# Patient Record
Sex: Female | Born: 2000 | Race: White | Hispanic: No | Marital: Single | Smoking: Never smoker
Health system: Southern US, Community
[De-identification: ages and names within clinical notes are randomized; demographics above are authoritative.]

## PROBLEM LIST (undated history)

## (undated) DIAGNOSIS — F329 Major depressive disorder, single episode, unspecified: Secondary | ICD-10-CM

## (undated) DIAGNOSIS — I959 Hypotension, unspecified: Secondary | ICD-10-CM

## (undated) DIAGNOSIS — F410 Panic disorder [episodic paroxysmal anxiety] without agoraphobia: Secondary | ICD-10-CM

## (undated) DIAGNOSIS — F32A Depression, unspecified: Secondary | ICD-10-CM

## (undated) DIAGNOSIS — F419 Anxiety disorder, unspecified: Secondary | ICD-10-CM

## (undated) HISTORY — PX: KIDNEY SURGERY: SHX687

## (undated) HISTORY — PX: ADENOIDECTOMY: SUR15

## (undated) HISTORY — PX: TONSILLECTOMY: SUR1361

## (undated) HISTORY — PX: JOINT REPLACEMENT: SHX530

---
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
## 2018-04-13 ENCOUNTER — Encounter (HOSPITAL_COMMUNITY): Payer: Self-pay | Admitting: Emergency Medicine

## 2018-04-13 ENCOUNTER — Other Ambulatory Visit: Payer: Self-pay

## 2018-04-13 ENCOUNTER — Emergency Department (HOSPITAL_COMMUNITY)
Admission: EM | Admit: 2018-04-13 | Discharge: 2018-04-13 | Disposition: A | Discharge status: Home / Self Care | Payer: Managed Care, Other (non HMO) | Attending: Emergency Medicine | Admitting: Emergency Medicine

## 2018-04-13 ENCOUNTER — Emergency Department (HOSPITAL_COMMUNITY): Payer: Managed Care, Other (non HMO)

## 2018-04-13 DIAGNOSIS — R112 Nausea with vomiting, unspecified: Secondary | ICD-10-CM | POA: Insufficient documentation

## 2018-04-13 DIAGNOSIS — N12 Tubulo-interstitial nephritis, not specified as acute or chronic: Secondary | ICD-10-CM

## 2018-04-13 DIAGNOSIS — R1031 Right lower quadrant pain: Secondary | ICD-10-CM | POA: Insufficient documentation

## 2018-04-13 DIAGNOSIS — N760 Acute vaginitis: Secondary | ICD-10-CM | POA: Diagnosis not present

## 2018-04-13 DIAGNOSIS — N10 Acute tubulo-interstitial nephritis: Secondary | ICD-10-CM | POA: Insufficient documentation

## 2018-04-13 DIAGNOSIS — Z79899 Other long term (current) drug therapy: Secondary | ICD-10-CM | POA: Diagnosis not present

## 2018-04-13 DIAGNOSIS — B9689 Other specified bacterial agents as the cause of diseases classified elsewhere: Secondary | ICD-10-CM

## 2018-04-13 DIAGNOSIS — R52 Pain, unspecified: Secondary | ICD-10-CM | POA: Diagnosis present

## 2018-04-13 HISTORY — DX: Hypotension, unspecified: I95.9

## 2018-04-13 HISTORY — DX: Major depressive disorder, single episode, unspecified: F32.9

## 2018-04-13 HISTORY — DX: Depression, unspecified: F32.A

## 2018-04-13 HISTORY — DX: Panic disorder (episodic paroxysmal anxiety): F41.0

## 2018-04-13 HISTORY — DX: Anxiety disorder, unspecified: F41.9

## 2018-04-13 LAB — URINALYSIS, ROUTINE W REFLEX MICROSCOPIC
Bilirubin Urine: NEGATIVE
GLUCOSE, UA: NEGATIVE mg/dL
Ketones, ur: 20 mg/dL — AB
NITRITE: POSITIVE — AB
PH: 5 (ref 5.0–8.0)
Protein, ur: 100 mg/dL — AB
Specific Gravity, Urine: 1.016 (ref 1.005–1.030)
WBC, UA: >50 WBC/hpf — ABNORMAL HIGH (ref 0–5)

## 2018-04-13 LAB — WET PREP, GENITAL
Sperm: NONE SEEN
TRICH WET PREP: NONE SEEN
Yeast Wet Prep HPF POC: NONE SEEN

## 2018-04-13 LAB — COMPREHENSIVE METABOLIC PANEL
ALK PHOS: 64 U/L (ref 38–126)
ALT: 13 U/L (ref 0–44)
AST: 20 U/L (ref 15–41)
Albumin: 4.5 g/dL (ref 3.5–5.0)
Anion gap: 11 (ref 5–15)
BILIRUBIN TOTAL: 1.9 mg/dL — AB (ref 0.3–1.2)
BUN: 12 mg/dL (ref 6–20)
CALCIUM: 9.3 mg/dL (ref 8.9–10.3)
CO2: 23 mmol/L (ref 22–32)
CREATININE: 1.07 mg/dL — AB (ref 0.44–1.00)
Chloride: 104 mmol/L (ref 98–111)
GFR calc Af Amer: >60 mL/min (ref >60)
Glucose, Bld: 93 mg/dL (ref 70–99)
POTASSIUM: 3.7 mmol/L (ref 3.5–5.1)
Sodium: 138 mmol/L (ref 135–145)
TOTAL PROTEIN: 7.2 g/dL (ref 6.5–8.1)

## 2018-04-13 LAB — CBC
HCT: 41.9 % (ref 36.0–46.0)
Hemoglobin: 14 g/dL (ref 12.0–15.0)
MCH: 32.3 pg (ref 26.0–34.0)
MCHC: 33.4 g/dL (ref 30.0–36.0)
MCV: 96.8 fL (ref 80.0–100.0)
PLATELETS: 178 10*3/uL (ref 150–400)
RBC: 4.33 MIL/uL (ref 3.87–5.11)
RDW: 11.5 % (ref 11.5–15.5)
WBC: 12.5 10*3/uL — AB (ref 4.0–10.5)
nRBC: 0 % (ref 0.0–0.2)

## 2018-04-13 LAB — INFLUENZA PANEL BY PCR (TYPE A & B)
INFLAPCR: NEGATIVE
Influenza B By PCR: NEGATIVE

## 2018-04-13 LAB — I-STAT BETA HCG BLOOD, ED (MC, WL, AP ONLY): I-stat hCG, quantitative: <5 m[IU]/mL (ref <5)

## 2018-04-13 LAB — LIPASE, BLOOD: Lipase: 22 U/L (ref 11–51)

## 2018-04-13 MED ORDER — IOPAMIDOL (ISOVUE-300) INJECTION 61%
INTRAVENOUS | Status: AC
  Filled 2018-04-13: qty 100

## 2018-04-13 MED ORDER — ONDANSETRON 4 MG PO TBDP: ORAL_TABLET | ORAL | 0 refills | Status: AC

## 2018-04-13 MED ORDER — IBUPROFEN 800 MG PO TABS
800.0000 mg | ORAL_TABLET | Freq: Once | ORAL | Status: AC | PRN
  Administered 2018-04-13: 800 mg via ORAL
  Filled 2018-04-13: qty 1

## 2018-04-13 MED ORDER — ONDANSETRON HCL 4 MG/2ML IJ SOLN
4.0000 mg | Freq: Once | INTRAMUSCULAR | Status: AC
  Administered 2018-04-13: 4 mg via INTRAVENOUS
  Filled 2018-04-13: qty 2

## 2018-04-13 MED ORDER — ONDANSETRON 4 MG PO TBDP: 4.0000 mg | ORAL_TABLET | Freq: Once | ORAL | Status: DC | PRN

## 2018-04-13 MED ORDER — SODIUM CHLORIDE 0.9 % IV BOLUS
1000.0000 mL | Freq: Once | INTRAVENOUS | Status: AC
  Administered 2018-04-13: 1000 mL via INTRAVENOUS

## 2018-04-13 MED ORDER — SODIUM CHLORIDE (PF) 0.9 % IJ SOLN
INTRAMUSCULAR | Status: AC
  Filled 2018-04-13: qty 50

## 2018-04-13 MED ORDER — IOPAMIDOL (ISOVUE-300) INJECTION 61%
100.0000 mL | Freq: Once | INTRAVENOUS | Status: AC | PRN
  Administered 2018-04-13: 100 mL via INTRAVENOUS

## 2018-04-13 MED ORDER — CEPHALEXIN 500 MG PO CAPS: 500.0000 mg | ORAL_CAPSULE | Freq: Three times a day (TID) | ORAL | 0 refills | Status: AC

## 2018-04-13 MED ORDER — SODIUM CHLORIDE 0.9 % IV SOLN
1.0000 g | Freq: Once | INTRAVENOUS | Status: AC
  Administered 2018-04-13: 1 g via INTRAVENOUS
  Filled 2018-04-13: qty 10

## 2018-04-13 MED ORDER — METRONIDAZOLE 500 MG PO TABS: 500.0000 mg | ORAL_TABLET | Freq: Two times a day (BID) | ORAL | 0 refills | Status: DC

## 2018-04-13 NOTE — ED Triage Notes (Signed)
Pt presents with a sudden onset of flu like symptoms. Patient states symptoms began yesterday. Nasal congestion, nausea, vomiting and no diarrhea. Last ibuprofen at 0900 this morning. Patient also complaining of lower abdominal pain and lower back pain with generalized body aches. No urinary symptoms. Patient is tachycardic on arrival.

## 2018-04-13 NOTE — ED Provider Notes (Signed)
[facility] DEPT Provider Note   CSN: [id number] Arrival date & time: 04/13/18  1857     History   Chief Complaint Chief Complaint  Patient presents with  . Flu Like Symptoms    HPI Theresa Lopez is a 17 y.o. female.  Theresa Lopez is a 17 y.o. female with a history of anxiety, depression, and panic attacks, who presents to the emergency department for evaluation of 2 days of generalized body aches, low back pain, abdominal pain, fevers and chills.  Since onset symptoms have been constant and worsening.  Patient reports that yesterday when she got off of work she was not feeling well, reports general malaise and that she started having pain over bilateral flanks.  Today she woke up with lower abdominal pain and began feeling nauseated and has had 3-4 episodes of vomiting today.  No hematemesis.  She denies any associated diarrhea, melena or hematochezia.  Has had some pain across the lower abdomen that is constant but intermittently worsens.  She reports fever of 101 just prior to arrival and she took 1 dose of Tylenol just prior to coming in, has had chills throughout the day.  No associated cough, congestion or rhinorrhea, no chest pain or shortness of breath.  No known sick contacts.  Does report history of recurrent UTIs in the past, and was seen by urology in [place] but does not have some when she regularly follows with urine [place].  She denies any vaginal discharge or bleeding, no pelvic pain.  Is currently sexually active with one partner.     Past Medical History:  Diagnosis Date  . Anxiety   . Depression   . Hypotension   . Panic attacks     There are no active problems to display for this patient.   Past Surgical History:  Procedure Laterality Date  . ADENOIDECTOMY    . TONSILLECTOMY       OB History   None      Home Medications    Prior to Admission medications   Medication Sig Start Date End Date Taking?  Authorizing Provider  acetaminophen (TYLENOL) 500 MG tablet Take 1,500 mg by mouth daily as needed for moderate pain or fever.   Yes [provider]  levonorgestrel (MIRENA) 20 MCG/24HR IUD 1 each by Intrauterine route once.   Yes [provider]  sertraline (ZOLOFT) 50 MG tablet Take 50 mg by mouth daily.    Yes [provider]  cephALEXin (KEFLEX) 500 MG capsule Take 1 capsule (500 mg total) by mouth 3 (three) times daily for 7 days. 04/13/18 04/20/18  Jacqlyn Larsen, PA-C  metroNIDAZOLE (FLAGYL) 500 MG tablet Take 1 tablet (500 mg total) by mouth 2 (two) times daily. One po bid x 7 days 04/13/18   Jacqlyn Larsen, PA-C  ondansetron [facility] ODT) 4 MG disintegrating tablet 4mg  ODT q4 hours prn nausea/vomit 04/13/18   Jacqlyn Larsen, PA-C    Family History No family history on file.  Social History Social History   Tobacco Use  . Smoking status: Never Smoker  . Smokeless tobacco: Never Used  Substance Use Topics  . Alcohol use: Yes    Comment: occassional  . Drug use: Yes    Types: Marijuana     Allergies   Codeine and Morphine and related   Review of Systems Review of Systems  Constitutional: Positive for chills and fever.  HENT: Negative for congestion, ear pain, rhinorrhea and sore throat.  Eyes: Negative for visual disturbance.  Respiratory: Negative for cough, shortness of breath and wheezing.   Cardiovascular: Negative for chest pain.  Gastrointestinal: Positive for abdominal pain, nausea and vomiting. Negative for blood in stool, constipation and diarrhea.  Genitourinary: Positive for flank pain. Negative for dysuria, frequency, hematuria, vaginal bleeding and vaginal discharge.  Musculoskeletal: Positive for back pain and myalgias. Negative for arthralgias, neck pain and neck stiffness.  Skin: Negative for color change and rash.  Neurological: Negative for dizziness, syncope and light-headedness.  Psychiatric/Behavioral: The patient is  nervous/anxious.      Physical Exam Updated Vital Signs BP 104/80   Pulse (!) 127   Temp 99.9 F (37.7 C)   Resp 17   SpO2 99%   Physical Exam  Constitutional: She appears well-developed and well-nourished.  Non-toxic appearance. She does not appear ill. No distress.  HENT:  Head: Normocephalic and atraumatic.  Mouth/Throat: Oropharynx is clear and moist.  Eyes: Right eye exhibits no discharge. Left eye exhibits no discharge.  Neck: Neck supple.  Cardiovascular: Normal rate, regular rhythm, normal heart sounds and intact distal pulses. Exam reveals no gallop and no friction rub.  No murmur heard. Pulmonary/Chest: Effort normal and breath sounds normal. No respiratory distress.  Respirations equal and unlabored, patient able to speak in full sentences, lungs clear to auscultation bilaterally  Abdominal: Soft. Bowel sounds are normal. She exhibits no distension and no mass. There is tenderness. There is guarding.  Abdomen soft, non-distended, bowel sounds present throughout, mild tenderness across the lower abdomen with some guarding in the RLQ without peritoneal signs, tenderness over both flanks  Genitourinary:  Genitourinary Comments: Chaperone present during pelvic exam. No external genital lesions noted. Small amount of white discharge present on vaginal vault, no bleeding, no evidence of cervicitis.  Bimanual exam reveals no cervical motion tenderness, and bilateral adnexa without masses or tenderness.  Musculoskeletal: She exhibits no deformity.  Neurological: She is alert. Coordination normal.  Skin: Skin is warm and dry. Capillary refill takes less than 2 seconds. She is not diaphoretic.  Psychiatric: She has a normal mood and affect. Her behavior is normal.  Nursing note and vitals reviewed.    ED Treatments / Results  Labs (all labs ordered are listed, but only abnormal results are displayed) Labs Reviewed  WET PREP, GENITAL - Abnormal; Notable for the following  components:      Result Value   Clue Cells Wet Prep HPF POC PRESENT (*)    WBC, Wet Prep HPF POC RARE (*)    All other components within normal limits  COMPREHENSIVE METABOLIC PANEL - Abnormal; Notable for the following components:   Creatinine, Ser 1.07 (*)    Total Bilirubin 1.9 (*)    All other components within normal limits  CBC - Abnormal; Notable for the following components:   WBC 12.5 (*)    All other components within normal limits  URINALYSIS, ROUTINE W REFLEX MICROSCOPIC - Abnormal; Notable for the following components:   APPearance HAZY (*)    Hgb urine dipstick SMALL (*)    Ketones, ur 20 (*)    Protein, ur 100 (*)    Nitrite POSITIVE (*)    Leukocytes, UA MODERATE (*)    WBC, UA >50 (*)    Bacteria, UA RARE (*)    All other components within normal limits  URINE CULTURE  LIPASE, BLOOD  INFLUENZA PANEL BY PCR (TYPE A & B)  RPR  HIV ANTIBODY (ROUTINE TESTING W REFLEX)  I-STAT BETA  HCG BLOOD, ED ([facility], [facility], [facility] ONLY)  GC/CHLAMYDIA PROBE AMP ([place]) NOT AT [facility]    EKG None  Radiology Ct Abdomen Pelvis W Contrast  Result Date: 04/13/2018 CLINICAL DATA:  Flu like symptoms nausea vomiting EXAM: CT ABDOMEN AND PELVIS WITH CONTRAST TECHNIQUE: Multidetector CT imaging of the abdomen and pelvis was performed using the standard protocol following bolus administration of intravenous contrast. CONTRAST:  147mL ISOVUE-300 IOPAMIDOL (ISOVUE-300) INJECTION 61% COMPARISON:  None. FINDINGS: Lower chest: No acute abnormality. Hepatobiliary: No focal liver abnormality is seen. No gallstones, gallbladder wall thickening, or biliary dilatation. Pancreas: Unremarkable. No pancreatic ductal dilatation or surrounding inflammatory changes. Spleen: Normal in size without focal abnormality. Adrenals/Urinary Tract: Adrenal glands are unremarkable. Kidneys are normal, without renal calculi, focal lesion, or hydronephrosis. Bladder is unremarkable. Stomach/Bowel: Stomach is within normal  limits. Appendix appears normal. No evidence of bowel wall thickening, distention, or inflammatory changes. Vascular/Lymphatic: No significant vascular findings are present. No enlarged abdominal or pelvic lymph nodes. Reproductive: Intrauterine device.  No adnexal mass. Other: No free air or free fluid. Musculoskeletal: No acute or significant osseous findings. IMPRESSION: Negative. No CT evidence for acute intra-abdominal or pelvic abnormality. Electronically Signed   By: Donavan Foil M.D.   On: 04/13/2018 22:01    Procedures Procedures (including critical care time)  Medications Ordered in ED Medications  ondansetron (ZOFRAN-ODT) disintegrating tablet 4 mg (has no administration in time range)  iopamidol (ISOVUE-300) 61 % injection (has no administration in time range)  sodium chloride (PF) 0.9 % injection (has no administration in time range)  cefTRIAXone (ROCEPHIN) 1 g in sodium chloride 0.9 % 100 mL IVPB (1 g Intravenous New Bag/Given 04/13/18 2245)  ibuprofen (ADVIL,MOTRIN) tablet 800 mg (800 mg Oral Given 04/13/18 2015)  sodium chloride 0.9 % bolus 1,000 mL (0 mLs Intravenous Stopped 04/13/18 2208)  ondansetron (ZOFRAN) injection 4 mg (4 mg Intravenous Given 04/13/18 2107)  iopamidol (ISOVUE-300) 61 % injection 100 mL (100 mLs Intravenous Contrast Given 04/13/18 2142)     Initial Impression / Assessment and Plan / ED Course  I have reviewed the triage vital signs and the nursing notes.  Pertinent labs & imaging results that were available during my care of the patient were reviewed by me and considered in my medical decision making (see chart for details).  Patient presents with 2 days of fevers, chills, generalized body aches, low back pain and lower abdominal pain.  History of recurrent UTIs but reports this feels different.  She has not had any associated cough, rhinorrhea, nasal congestion or sore throat.  No chest pain or shortness of breath.  Denies focal urinary symptoms.  No  diarrhea, or blood in the stool.  Denies vaginal discharge or pelvic discomfort.  On exam patient was initially tachycardic to 127 with low-grade fever, but this resolved with treatment with ibuprofen.  On my evaluation patient appears calm and in no acute distress, there is mild tenderness across the lower abdomen with some guarding in the right lower quadrant, patient also has tenderness over bilateral flanks.  Will get abdominal labs and perform pelvic exam, and get CT abdomen pelvis given focal right lower quadrant tenderness to assess for any appendicitis, given flank pain patient could also have pyelonephritis, or renal stone, or other acute intra-abdominal pathology.  Pelvic exam non-concerning for PID, wet prep shows signs of BV, other STD testing collected.  Patient with mild leukocytosis of 12.5, stable hemoglobin, no acute electrolyte derangements, creatinine of 1.07, normal liver function and  lipase.  Urinalysis consistent with infection with positive nitrates, moderate leukocytes, greater than 50 WBCs and white cell clumps and bacteria present.  Given patient's presentation this is most concerning for pyelonephritis.  CT abdomen pelvis shows no evidence of appendicitis, no obstructive uropathy or hydronephrosis, low presentation is concerning for pyelonephritis this does not appear to be severe enough to cause inflammation within the kidney or any abscess or phlegmon.  Urine culture has been sent, will treat with 1 dose of IV Rocephin and then she will be discharged with Keflex.  After pain medication, Zofran and fluids patient is tolerating p.o. and reports significant improvement in her symptoms, vitals remained normal.   Patient will be discharged with Keflex, Zofran, and Flagyl for BV.  I discussed strict return precautions with the patient.  She reports issues with recurrent UTIs, and when she was living and he saw urologist, been on any chronic medications for this.  Will have patient  follow-up with urology here.  Patient expresses understanding and is in agreement with plan.  Stable for discharge home at this time.  Final Clinical Impressions(s) / ED Diagnoses   Final diagnoses:  Pyelonephritis  BV (bacterial vaginosis)    ED Discharge Orders         Ordered    cephALEXin (KEFLEX) 500 MG capsule  3 times daily     04/13/18 2235    ondansetron (ZOFRAN ODT) 4 MG disintegrating tablet     04/13/18 2235    metroNIDAZOLE (FLAGYL) 500 MG tablet  2 times daily     04/13/18 2235           Janet Berlin 04/14/18 2257    Davonna Belling, MD 04/14/18 [phone]

## 2018-04-13 NOTE — Discharge Instructions (Addendum)
Your symptoms are likely due to pyelonephritis which is a urinary tract infection that is moved up to the kidneys, this can cause fevers, chills, body aches, flank pain, nausea and vomiting.  Please take antibiotics as directed, use Zofran as needed for nausea, and Tylenol and ibuprofen for pain.  Your wet prep showed BV, please take Flagyl to treat this, do not drink alcohol while taking this medication.  You have STD testing pending will be contacted in 2 to 3 days with any positive results.  Your CT scan and labs otherwise look good.  Return to the emergency department for worsening pain, persistent vomiting, high fevers or any other new or concerning symptoms.

## 2018-04-14 ENCOUNTER — Observation Stay (HOSPITAL_COMMUNITY)
Admission: EM | Admit: 2018-04-14 | Discharge: 2018-04-16 | Disposition: A | Discharge status: Home / Self Care | Payer: Managed Care, Other (non HMO) | Attending: Internal Medicine | Admitting: Internal Medicine

## 2018-04-14 ENCOUNTER — Encounter (HOSPITAL_COMMUNITY): Payer: Self-pay

## 2018-04-14 ENCOUNTER — Other Ambulatory Visit: Payer: Self-pay

## 2018-04-14 DIAGNOSIS — F419 Anxiety disorder, unspecified: Secondary | ICD-10-CM | POA: Diagnosis not present

## 2018-04-14 DIAGNOSIS — Z9489 Other transplanted organ and tissue status: Secondary | ICD-10-CM | POA: Diagnosis not present

## 2018-04-14 DIAGNOSIS — N10 Acute tubulo-interstitial nephritis: Secondary | ICD-10-CM | POA: Diagnosis not present

## 2018-04-14 DIAGNOSIS — Z885 Allergy status to narcotic agent status: Secondary | ICD-10-CM | POA: Insufficient documentation

## 2018-04-14 DIAGNOSIS — B9620 Unspecified Escherichia coli [E. coli] as the cause of diseases classified elsewhere: Secondary | ICD-10-CM | POA: Diagnosis present

## 2018-04-14 DIAGNOSIS — Z87440 Personal history of urinary (tract) infections: Secondary | ICD-10-CM | POA: Insufficient documentation

## 2018-04-14 DIAGNOSIS — N12 Tubulo-interstitial nephritis, not specified as acute or chronic: Secondary | ICD-10-CM

## 2018-04-14 DIAGNOSIS — Z8249 Family history of ischemic heart disease and other diseases of the circulatory system: Secondary | ICD-10-CM | POA: Insufficient documentation

## 2018-04-14 DIAGNOSIS — D509 Iron deficiency anemia, unspecified: Secondary | ICD-10-CM | POA: Insufficient documentation

## 2018-04-14 DIAGNOSIS — N390 Urinary tract infection, site not specified: Secondary | ICD-10-CM

## 2018-04-14 DIAGNOSIS — Z79899 Other long term (current) drug therapy: Secondary | ICD-10-CM | POA: Diagnosis not present

## 2018-04-14 DIAGNOSIS — F410 Panic disorder [episodic paroxysmal anxiety] without agoraphobia: Secondary | ICD-10-CM | POA: Diagnosis not present

## 2018-04-14 DIAGNOSIS — R112 Nausea with vomiting, unspecified: Secondary | ICD-10-CM | POA: Diagnosis present

## 2018-04-14 DIAGNOSIS — F329 Major depressive disorder, single episode, unspecified: Secondary | ICD-10-CM | POA: Insufficient documentation

## 2018-04-14 DIAGNOSIS — F17290 Nicotine dependence, other tobacco product, uncomplicated: Secondary | ICD-10-CM | POA: Insufficient documentation

## 2018-04-14 LAB — URINALYSIS, ROUTINE W REFLEX MICROSCOPIC
Bilirubin Urine: NEGATIVE
Glucose, UA: NEGATIVE mg/dL
Hgb urine dipstick: NEGATIVE
KETONES UR: 80 mg/dL — AB
NITRITE: NEGATIVE
PROTEIN: 100 mg/dL — AB
Specific Gravity, Urine: 1.025 (ref 1.005–1.030)
pH: 5 (ref 5.0–8.0)

## 2018-04-14 LAB — CBC WITH DIFFERENTIAL/PLATELET
ABS IMMATURE GRANULOCYTES: 0.23 10*3/uL — AB (ref 0.00–0.07)
BASOS ABS: 0 10*3/uL (ref 0.0–0.1)
Basophils Relative: 0 %
Eosinophils Absolute: 0 10*3/uL (ref 0.0–0.5)
Eosinophils Relative: 0 %
HEMATOCRIT: 41.1 % (ref 36.0–46.0)
HEMOGLOBIN: 13.6 g/dL (ref 12.0–15.0)
Immature Granulocytes: 1 %
LYMPHS ABS: 1 10*3/uL (ref 0.7–4.0)
LYMPHS PCT: 5 %
MCH: 32.5 pg (ref 26.0–34.0)
MCHC: 33.1 g/dL (ref 30.0–36.0)
MCV: 98.3 fL (ref 80.0–100.0)
Monocytes Absolute: 2 10*3/uL — ABNORMAL HIGH (ref 0.1–1.0)
Monocytes Relative: 10 %
NRBC: 0 % (ref 0.0–0.2)
Neutro Abs: 15.9 10*3/uL — ABNORMAL HIGH (ref 1.7–7.7)
Neutrophils Relative %: 84 %
Platelets: 155 10*3/uL (ref 150–400)
RBC: 4.18 MIL/uL (ref 3.87–5.11)
RDW: 11.7 % (ref 11.5–15.5)
WBC: 19.1 10*3/uL — AB (ref 4.0–10.5)

## 2018-04-14 LAB — RPR: RPR: NONREACTIVE

## 2018-04-14 LAB — GC/CHLAMYDIA PROBE AMP (~~LOC~~) NOT AT ARMC
CHLAMYDIA, DNA PROBE: NEGATIVE
NEISSERIA GONORRHEA: NEGATIVE

## 2018-04-14 LAB — HIV ANTIBODY (ROUTINE TESTING W REFLEX): HIV Screen 4th Generation wRfx: NONREACTIVE

## 2018-04-14 LAB — POC URINE PREG, ED: Preg Test, Ur: NEGATIVE

## 2018-04-14 MED ORDER — SODIUM CHLORIDE 0.9 % IV SOLN
1.0000 g | Freq: Once | INTRAVENOUS | Status: AC
  Administered 2018-04-14: 1 g via INTRAVENOUS
  Filled 2018-04-14: qty 10

## 2018-04-14 MED ORDER — ONDANSETRON HCL 4 MG/2ML IJ SOLN
4.0000 mg | Freq: Once | INTRAMUSCULAR | Status: AC
  Administered 2018-04-14: 4 mg via INTRAVENOUS
  Filled 2018-04-14: qty 2

## 2018-04-14 MED ORDER — KETOROLAC TROMETHAMINE 30 MG/ML IJ SOLN
30.0000 mg | Freq: Once | INTRAMUSCULAR | Status: AC
  Administered 2018-04-14: 30 mg via INTRAVENOUS
  Filled 2018-04-14: qty 1

## 2018-04-14 MED ORDER — SODIUM CHLORIDE 0.9 % IV BOLUS
1000.0000 mL | Freq: Once | INTRAVENOUS | Status: AC
  Administered 2018-04-14: 1000 mL via INTRAVENOUS

## 2018-04-14 NOTE — ED Provider Notes (Signed)
[facility] DEPT Provider Note   CSN: [id number] Arrival date & time: 04/14/18  2015     History   Chief Complaint Chief Complaint  Patient presents with  . Flank Pain    HPI Theresa Lopez is a 17 y.o. female.  The history is provided by the patient and medical records.     17 year old female with history of anxiety, depression, presenting to the ED with right flank pain.  States she was seen in the ED yesterday for nausea, vomiting, abdominal pain, subjective fever and chills.  States initially she thought she had the flu.  She had extensive work-up here resulting in diagnosis of pyelonephritis.  States she was feeling better in the ED and discharged home with oral medications, however all day today she has had chills with repetitive nausea and vomiting.  States she is not able to really get comfortable and feels very restless.  Does have history of ureter surgery at age of 1.  Past Medical History:  Diagnosis Date  . Anxiety   . Depression   . Hypotension   . Panic attacks     There are no active problems to display for this patient.   Past Surgical History:  Procedure Laterality Date  . ADENOIDECTOMY    . TONSILLECTOMY       OB History   None      Home Medications    Prior to Admission medications   Medication Sig Start Date End Date Taking? Authorizing Provider  acetaminophen (TYLENOL) 500 MG tablet Take 1,500 mg by mouth daily as needed for mild pain, moderate pain or fever.    Yes [provider]  cephALEXin (KEFLEX) 500 MG capsule Take 1 capsule (500 mg total) by mouth 3 (three) times daily for 7 days. 04/13/18 04/20/18 Yes Jacqlyn Larsen, PA-C  ferrous sulfate 325 (65 FE) MG EC tablet Take 325 mg by mouth daily.   Yes [provider]  ibuprofen (ADVIL,MOTRIN) 200 MG tablet Take 400 mg by mouth every 6 (six) hours as needed for mild pain.   Yes [provider]  levonorgestrel (MIRENA) 20  MCG/24HR IUD 1 each by Intrauterine route once.   Yes [provider]  metroNIDAZOLE (FLAGYL) 500 MG tablet Take 1 tablet (500 mg total) by mouth 2 (two) times daily. One po bid x 7 days 04/13/18  Yes Benedetto Goad N, PA-C  ondansetron (ZOFRAN ODT) 4 MG disintegrating tablet 4mg  ODT q4 hours prn nausea/vomit Patient taking differently: Take 4 mg by mouth every 4 (four) hours as needed for nausea or vomiting.  04/13/18  Yes Jacqlyn Larsen, PA-C  Probiotic Product (PROBIOTIC PO) Take 1 capsule by mouth daily.   Yes [provider]  sertraline (ZOLOFT) 50 MG tablet Take 50 mg by mouth daily.    Yes [provider]    Family History History reviewed. No pertinent family history.  Social History Social History   Tobacco Use  . Smoking status: Never Smoker  . Smokeless tobacco: Never Used  Substance Use Topics  . Alcohol use: Yes    Comment: occassional  . Drug use: Yes    Types: Marijuana     Allergies   Codeine and Morphine and related   Review of Systems Review of Systems  Constitutional: Positive for chills.  Gastrointestinal: Positive for nausea and vomiting.  Genitourinary: Positive for flank pain.  All other systems reviewed and are negative.    Physical Exam Updated Vital Signs BP Marland Kitchen)  101/58   Pulse 82   Temp 98.8 F (37.1 C) (Oral)   Resp 16   SpO2 96%   Physical Exam  Constitutional: She is oriented to person, place, and time. She appears well-developed and well-nourished.  Appears uncomfortable, tearful  HENT:  Head: Normocephalic and atraumatic.  Mouth/Throat: Oropharynx is clear and moist.  Eyes: Pupils are equal, round, and reactive to light. Conjunctivae and EOM are normal.  Neck: Normal range of motion.  Cardiovascular: Normal rate, regular rhythm and normal heart sounds.  Pulmonary/Chest: Effort normal and breath sounds normal. No stridor. No respiratory distress.  Abdominal: Soft. Bowel sounds are normal. There is no  tenderness. There is no rebound.  No focal CVA tenderness but discomfort noted throughout right flank  Musculoskeletal: Normal range of motion.  Neurological: She is alert and oriented to person, place, and time.  Skin: Skin is warm and dry.  Psychiatric: She has a normal mood and affect.  Nursing note and vitals reviewed.    ED Treatments / Results  Labs (all labs ordered are listed, but only abnormal results are displayed) Labs Reviewed  URINALYSIS, ROUTINE W REFLEX MICROSCOPIC - Abnormal; Notable for the following components:      Result Value   Color, Urine AMBER (*)    APPearance HAZY (*)    Ketones, ur 80 (*)    Protein, ur 100 (*)    Leukocytes, UA SMALL (*)    Bacteria, UA RARE (*)    Non Squamous Epithelial 0-5 (*)    All other components within normal limits  CBC WITH DIFFERENTIAL/PLATELET - Abnormal; Notable for the following components:   WBC 19.1 (*)    Neutro Abs 15.9 (*)    Monocytes Absolute 2.0 (*)    Abs Immature Granulocytes 0.23 (*)    All other components within normal limits  BASIC METABOLIC PANEL - Abnormal; Notable for the following components:   Glucose, Bld 112 (*)    Creatinine, Ser 1.17 (*)    All other components within normal limits  POC URINE PREG, ED    EKG None  Radiology Ct Abdomen Pelvis W Contrast  Result Date: 04/13/2018 CLINICAL DATA:  Flu like symptoms nausea vomiting EXAM: CT ABDOMEN AND PELVIS WITH CONTRAST TECHNIQUE: Multidetector CT imaging of the abdomen and pelvis was performed using the standard protocol following bolus administration of intravenous contrast. CONTRAST:  167mL ISOVUE-300 IOPAMIDOL (ISOVUE-300) INJECTION 61% COMPARISON:  None. FINDINGS: Lower chest: No acute abnormality. Hepatobiliary: No focal liver abnormality is seen. No gallstones, gallbladder wall thickening, or biliary dilatation. Pancreas: Unremarkable. No pancreatic ductal dilatation or surrounding inflammatory changes. Spleen: Normal in size without  focal abnormality. Adrenals/Urinary Tract: Adrenal glands are unremarkable. Kidneys are normal, without renal calculi, focal lesion, or hydronephrosis. Bladder is unremarkable. Stomach/Bowel: Stomach is within normal limits. Appendix appears normal. No evidence of bowel wall thickening, distention, or inflammatory changes. Vascular/Lymphatic: No significant vascular findings are present. No enlarged abdominal or pelvic lymph nodes. Reproductive: Intrauterine device.  No adnexal mass. Other: No free air or free fluid. Musculoskeletal: No acute or significant osseous findings. IMPRESSION: Negative. No CT evidence for acute intra-abdominal or pelvic abnormality. Electronically Signed   By: Donavan Foil M.D.   On: 04/13/2018 22:01    Procedures Procedures (including critical care time)  Medications Ordered in ED Medications  sodium chloride 0.9 % bolus 1,000 mL (1,000 mLs Intravenous New Bag/Given 04/14/18 2312)  ondansetron (ZOFRAN) injection 4 mg (4 mg Intravenous Given 04/14/18 2310)  ketorolac (TORADOL) 30 MG/ML  injection 30 mg (30 mg Intravenous Given 04/14/18 2310)  cefTRIAXone (ROCEPHIN) 1 g in sodium chloride 0.9 % 100 mL IVPB ( Intravenous Rate/Dose Verify 04/14/18 2324)     Initial Impression / Assessment and Plan / ED Course  I have reviewed the triage vital signs and the nursing notes.  Pertinent labs & imaging results that were available during my care of the patient were reviewed by me and considered in my medical decision making (see chart for details).  17 year old female here with right flank pain.  Seen in the ED yesterday with extensive work-up including CT scan, ultimate diagnosis of pyelonephritis.  Discharged home with oral medications but unable to tolerate at home.  She has had repetitive nausea and vomiting today.  Here patient appears uncomfortable and is tearful.  She appears pale and clinically dry.  Does have some discomfort throughout the right flank but no focal right  CVA tenderness.  We will plan to give IV fluids, second dose of IV Rocephin as well as antiemetics.  Urine culture from yesterday still pending.  Will repeat basic labs and monitor closely.    12:19 AM Patient still appears unwell, still uncomfortable.  Labs today with increased WBC count to 19K, >80 ketones in urine consistent with dehydration.  Given that she is not tolerating PO, do not feel she will do well as an OP.  Feel she will require admission for ongoing IV abx for pyelonephritis.  Patient agreeable.    Discussed with Dr. Stana Bunting-- he will admit for ongoing care.  Final Clinical Impressions(s) / ED Diagnoses   Final diagnoses:  Pyelonephritis  Non-intractable vomiting with nausea, unspecified vomiting type    ED Discharge Orders    None       Larene Pickett, PA-C 04/15/18 [id number]    Tegeler, Gwenyth Allegra, MD 04/15/18 [phone]

## 2018-04-14 NOTE — ED Triage Notes (Signed)
Pt reports back pain. She was seen last night for same and dx'd with pyelonephritis. Hx of ureter surgery when she was 1. She reports nausea, vomiting, fever, and cloudy urine.

## 2018-04-15 DIAGNOSIS — N12 Tubulo-interstitial nephritis, not specified as acute or chronic: Secondary | ICD-10-CM | POA: Diagnosis not present

## 2018-04-15 LAB — BASIC METABOLIC PANEL
ANION GAP: 11 (ref 5–15)
Anion gap: 6 (ref 5–15)
BUN: 13 mg/dL (ref 6–20)
BUN: 14 mg/dL (ref 6–20)
CALCIUM: 8.1 mg/dL — AB (ref 8.9–10.3)
CO2: 25 mmol/L (ref 22–32)
CO2: 24 mmol/L (ref 22–32)
CREATININE: 1.09 mg/dL — AB (ref 0.44–1.00)
Calcium: 9.1 mg/dL (ref 8.9–10.3)
Chloride: 105 mmol/L (ref 98–111)
Chloride: 108 mmol/L (ref 98–111)
Creatinine, Ser: 1.17 mg/dL — ABNORMAL HIGH (ref 0.44–1.00)
GFR calc Af Amer: >60 mL/min (ref >60)
GFR calc non Af Amer: >60 mL/min (ref >60)
GLUCOSE: 112 mg/dL — AB (ref 70–99)
Glucose, Bld: 118 mg/dL — ABNORMAL HIGH (ref 70–99)
POTASSIUM: 4.5 mmol/L (ref 3.5–5.1)
Potassium: 3.8 mmol/L (ref 3.5–5.1)
Sodium: 141 mmol/L (ref 135–145)
Sodium: 138 mmol/L (ref 135–145)

## 2018-04-15 LAB — CBC
HCT: 34.1 % — ABNORMAL LOW (ref 36.0–46.0)
Hemoglobin: 11.7 g/dL — ABNORMAL LOW (ref 12.0–15.0)
MCH: 32.8 pg (ref 26.0–34.0)
MCHC: 34.3 g/dL (ref 30.0–36.0)
MCV: 95.5 fL (ref 80.0–100.0)
NRBC: 0 % (ref 0.0–0.2)
PLATELETS: 160 10*3/uL (ref 150–400)
RBC: 3.57 MIL/uL — ABNORMAL LOW (ref 3.87–5.11)
RDW: 11.8 % (ref 11.5–15.5)
WBC: 14.7 10*3/uL — ABNORMAL HIGH (ref 4.0–10.5)

## 2018-04-15 MED ORDER — PROCHLORPERAZINE EDISYLATE 10 MG/2ML IJ SOLN
10.0000 mg | INTRAMUSCULAR | Status: DC | PRN
  Administered 2018-04-15 (×2): 10 mg via INTRAVENOUS
  Filled 2018-04-15 (×2): qty 2

## 2018-04-15 MED ORDER — LACTATED RINGERS IV BOLUS
1000.0000 mL | Freq: Once | INTRAVENOUS | Status: AC
  Administered 2018-04-15: 1000 mL via INTRAVENOUS

## 2018-04-15 MED ORDER — SODIUM CHLORIDE 0.9 % IV SOLN: 1.0000 g | INTRAVENOUS | Status: DC

## 2018-04-15 MED ORDER — SODIUM CHLORIDE 0.9 % IV SOLN
1.0000 g | INTRAVENOUS | Status: DC
  Administered 2018-04-15: 1 g via INTRAVENOUS
  Filled 2018-04-15: qty 1

## 2018-04-15 MED ORDER — ZOLPIDEM TARTRATE 5 MG PO TABS
5.0000 mg | ORAL_TABLET | Freq: Once | ORAL | Status: AC
  Administered 2018-04-16: 5 mg via ORAL
  Filled 2018-04-15: qty 1

## 2018-04-15 MED ORDER — ACETAMINOPHEN 325 MG PO TABS
650.0000 mg | ORAL_TABLET | ORAL | Status: DC | PRN
  Administered 2018-04-15 (×2): 650 mg via ORAL
  Filled 2018-04-15 (×2): qty 2

## 2018-04-15 MED ORDER — METRONIDAZOLE 500 MG PO TABS
500.0000 mg | ORAL_TABLET | Freq: Two times a day (BID) | ORAL | Status: DC
  Administered 2018-04-15 – 2018-04-16 (×4): 500 mg via ORAL
  Filled 2018-04-15 (×4): qty 1

## 2018-04-15 MED ORDER — SODIUM CHLORIDE 0.9% FLUSH
3.0000 mL | Freq: Two times a day (BID) | INTRAVENOUS | Status: DC
  Administered 2018-04-15: 3 mL via INTRAVENOUS

## 2018-04-15 MED ORDER — SERTRALINE HCL 50 MG PO TABS
50.0000 mg | ORAL_TABLET | Freq: Every day | ORAL | Status: DC
  Administered 2018-04-15 – 2018-04-16 (×2): 50 mg via ORAL
  Filled 2018-04-15 (×2): qty 1

## 2018-04-15 MED ORDER — ENOXAPARIN SODIUM 40 MG/0.4ML ~~LOC~~ SOLN
40.0000 mg | Freq: Every day | SUBCUTANEOUS | Status: DC
  Filled 2018-04-15 (×2): qty 0.4

## 2018-04-15 MED ORDER — KETOROLAC TROMETHAMINE 15 MG/ML IJ SOLN
15.0000 mg | Freq: Four times a day (QID) | INTRAMUSCULAR | Status: DC | PRN
  Administered 2018-04-15: 15 mg via INTRAVENOUS
  Filled 2018-04-15 (×2): qty 1

## 2018-04-15 MED ORDER — LACTATED RINGERS IV SOLN
INTRAVENOUS | Status: AC
  Administered 2018-04-15 (×2): via INTRAVENOUS

## 2018-04-15 NOTE — ED Notes (Signed)
Hospital bed requested for patient.  Attending MD contacted regarding pain medication for patient.  Pt stating pain is mild, but "coming back".  Pt resting in stretcher, denies additional needs at this time.

## 2018-04-15 NOTE — Progress Notes (Signed)
Patient was transferred from ED at 0751. Alert and oriented x4. Vital signs was taken. RN got report at 107.

## 2018-04-15 NOTE — H&P (Signed)
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  She felt poorly on April 13, 2018 in the afternoon/evening.  She had rather rapid development of chills, fever, nausea, vomiting, sweating and abdominal and back pain.  Associated symptom of headache.  She denies any lower urinary tract symptoms including frequency and dysuria.  She was evaluated in emergency department the day prior to admission where she is evaluate the CT scan of the abdomen and pelvis which did not show acute abnormality, extensive evaluation revealed lab evidence of a urinary tract infection and she was given a dose of IV ceftriaxone and discharged on p.o. antimicrobial therapy.  Also diagnosed with bacterial vaginosis prescribed metronidazole.  However, due to persistent symptoms, sought medical evaluation today.  At baseline, she is a Electronics engineer.  She reports having well-controlled anxiety and depression.  She uses E cigarettes and marijuana.  She is accompanied by her boyfriend.  Formerly was a Nurse, adult.  Home is in [place].  ED Course: In the emergency department vital signs remarkable for heart rate 91, systolic blood pressure 932, other vital signs within normal limits.  White count of 19.1, with neutrophil predominance.  BMP remarkable for creatinine of 1.2.  Urinalysis with ketones.  Urine culture from April 13, 2018 pending.  She was treated with IV ceftriaxone, 1 L normal saline bolus, IV Zofran,  and Toradol.  Hospital medicine consulted for further management.  Review of Systems: A complete ROS was obtained; pertinent positives negatives are denoted in the HPI. Otherwise, all systems are negative.   Past Medical History:  Diagnosis Date  . Anxiety   . Depression   . Hypotension   . Panic attacks    Social History   Socioeconomic History  . Marital status: Single    Spouse name: Not on file  . Number of children: Not on file  . Years of education: Not on file  . Highest education level: Not on file  Occupational History  . Not on file  Social Needs  . Financial resource strain: Not on file  . Food insecurity:    Worry: Not on file    Inability: Not on file  . Transportation needs:    Medical: Not on file    Non-medical: Not on file  Tobacco Use  . Smoking status: Never Smoker  . Smokeless tobacco: Never Used  Substance and Sexual Activity  . Alcohol use: Yes    Comment: occassional  . Drug use: Yes    Types: Marijuana  . Sexual activity: Yes    Birth control/protection: IUD  Lifestyle  . Physical activity:    Days per week: Not on file    Minutes per session: Not on file  . Stress: Not on file  Relationships  . Social connections:    Talks on phone: Not on file    Gets together: Not on file    Attends religious service: Not on file    Active member of club or organization: Not on file    Attends meetings of clubs or organizations: Not on file  Relationship status: Not on file  . Intimate partner violence:    Fear of current or ex partner: Not on file    Emotionally abused: Not on file    Physically abused: Not on file    Forced sexual activity: Not on file  Other Topics Concern  . Not on file  Social History Narrative  . Not on file   Family history: Father is alive age 67 with hypertension, mother is living at age 23 in good health.  Physical Exam: Vitals:   04/14/18 2040 04/14/18 2210 04/14/18 2330 04/15/18 0028  BP: 99/60 (!) 101/58  99/72 102/66  Pulse: 91 82 72 (!) 59  Resp: 16 16 16 16   Temp: 99.4 F (37.4 C) 98.8 F (37.1 C)    TempSrc: Oral Oral    SpO2: 96% 96% 100% 97%   General: Appears calm and comfortable, young white woman ENT: Grossly normal hearing, dry mucus membranes Cardiovascular: RRR. No M/R/G. No LE edema.  Respiratory: CTA bilaterally. No wheezes or crackles. Normal respiratory effort. Abdomen: Soft, non-tender. Skin: No rash or induration seen on limited exam. Multiple tattoos present. Musculoskeletal: Grossly normal tone BUE/BLE. Appropriate ROM.  GU: RIGHT CVA tenderness to direct percussion Psychiatric: Grossly normal mood and affect. Neurologic: Moves all extremities in coordinated fashion.  I have personally reviewed the following labs, culture data, and imaging studies.  Assessment/Plan:  #Sepsis from pyelonephritis Course: presenting with back pain, nausea, vomiting, fever. Found to have leukocytosis, HR > 90.  A/P: Young woman with recurrent UTIs; however, typically for her there are more lower urinary tract symptoms. No overt structural abnormalities seen on recent CT of the abdomen and pelvis. However, with hx of ureteral reflux s/p surgery at age 17 yo for correction - recurrent UTIs are concerning for possibly underlying anatomic abnormality warranting consideration of urologic intervention.  She failed OP management due to inability to manage symptoms and hold down PO.  Will continue ceftriaxone (if does not improve within 24 hours, plan to broaden), await urine culture speciation and sensitivity.  Will provide 1 liter LR bolus followed by 100 cc q hr x 10 hrs then re-assess. IV anti-emetics for nausea control.   #Other problems: -Bacterial vaginosis: diagnosed in the emergency department, will plan on completing metronidazole course -Anxiety / depression: continue home SSRI -Hx of iron deficiency: hold given nausea / vomiting for now -AKI: on admission Cr of 1.2, baseline Cr  likely < 0.9, monitor with hydration  DVT prophylaxis: Subq Lovenox Code Status: full code Disposition Plan: Anticipate D/C home once clinically improved Consults called: none Admission status: admit to hospital medicine.   Cheri Rous, MD [facility] Page:[phone]  If 7PM-7AM, please contact night-coverage [email or web address] Password [email or web address]  This document was created using the aid of voice recognition / dication software.

## 2018-04-15 NOTE — Progress Notes (Signed)
PROGRESS NOTE                                                                                                                                                                                                             Patient Demographics:    Theresa Lopez, is a 17 y.o. female, DOB - 2000/11/30, KDT:[id number]  Admit date - 04/14/2018   Admitting Physician Vilma Prader, MD  Outpatient Primary MD for the patient is Patient, No Pcp Per  LOS - 0  Outpatient Specialists: None  Chief Complaint  Patient presents with  . Flank Pain       Brief Narrative 17 year old female with anxiety, depression, on SSRI, iron deficiency anemia on supplement and recurrent UTI with reported congenital ureteral reflux (?  Cadaveric ureteral transplant) and multiple UTIs (8 in the past 12 months) with failure of outpatient antibiotic was admitted for UTI and acute pyelonephritis.  Patient developed acute onset of chills with fever, nausea, vomiting and diaphoresis with right lower abdomen and back pain on the day prior to admission.  She was seen in the ED with a CT of the abdomen pelvis was unremarkable and UA showing UTI, was given a dose of IV Rocephin and discharged home on p.o. antibiotic.  In the ED vitals were stable with WC of 19 K, UA positive for UTI.    Subjective:   Patient complains of right lower quadrant and flank pain.  Denies further nausea vomiting.  Afebrile.   Assessment  & Plan :   Principal problem Acute pyelonephritis/E. coli UTI Continue empiric Rocephin.  Sensitivity pending.  Leukocytosis slowly improving. Pain control with PRN Toradol.  Supportive care with Phenergan.  History of recurrent UTI. We will get further information from her mother was planning to fly in from [place] today.  Anxiety/depression Continue Zoloft.  Code Status : Full code  Family Communication  : Boyfriend at  bedside  Disposition Plan  : Home possibly tomorrow if clinically improved and pending sensitivity  Barriers For Discharge : Active symptoms  Consults  : None  Procedures  : CT abdomen and pelvis  DVT Prophylaxis  :  Lovenox -   Lab Results  Component Value Date   PLT 160 04/15/2018    Antibiotics  :    Anti-infectives (From admission, onward)   Start  Dose/Rate Route Frequency Ordered Stop   04/16/18 2000  cefTRIAXone (ROCEPHIN) 1 g in sodium chloride 0.9 % 100 mL IVPB  Status:  Discontinued     1 g 200 mL/hr over 30 Minutes Intravenous Every 24 hours 04/15/18 0319 04/15/18 0811   04/15/18 2200  cefTRIAXone (ROCEPHIN) 1 g in sodium chloride 0.9 % 100 mL IVPB     1 g 200 mL/hr over 30 Minutes Intravenous Every 24 hours 04/15/18 0811     04/15/18 0330  metroNIDAZOLE (FLAGYL) tablet 500 mg    Note to Pharmacy:  One po bid x 7 days     500 mg Oral 2 times daily 04/15/18 0319     04/14/18 2245  cefTRIAXone (ROCEPHIN) 1 g in sodium chloride 0.9 % 100 mL IVPB     1 g 200 mL/hr over 30 Minutes Intravenous  Once 04/14/18 2232 04/15/18 0318        Objective:   Vitals:   04/15/18 0656 04/15/18 0800 04/15/18 0801 04/15/18 1354  BP: (!) 97/59 98/60 (!) 94/59 94/63  Pulse: 61  64 72  Resp: 16  18 20   Temp:   97.9 F (36.6 C) 98.4 F (36.9 C)  TempSrc:      SpO2: 97%  99% 100%    Wt Readings from Last 3 Encounters:  No data found for Wt     Intake/Output Summary (Last 24 hours) at 04/15/2018 1504 Last data filed at 04/15/2018 1500 Gross per 24 hour  Intake 2568.67 ml  Output -  Net 2568.67 ml     Physical Exam  Gen: not in distress HEENT: , moist mucosa, supple neck Chest: clear b/l, no added sounds CVS: N S1&S2, no murmurs,  GI: soft, nondistended, bowel sounds present, right lower quadrant and flank tenderness Musculoskeletal: warm, no edema     Data Review:    CBC Recent Labs  Lab 04/13/18 2052 04/14/18 2230 04/15/18 0621  WBC 12.5* 19.1*  14.7*  HGB 14.0 13.6 11.7*  HCT 41.9 41.1 34.1*  PLT 178 155 160  MCV 96.8 98.3 95.5  MCH 32.3 32.5 32.8  MCHC 33.4 33.1 34.3  RDW 11.5 11.7 11.8  LYMPHSABS  --  1.0  --   MONOABS  --  2.0*  --   EOSABS  --  0.0  --   BASOSABS  --  0.0  --     Chemistries  Recent Labs  Lab 04/13/18 2052 04/14/18 2230 04/15/18 0621  NA 138 141 138  K 3.7 4.5 3.8  CL 104 105 108  CO2 23 25 24   GLUCOSE 93 112* 118*  BUN 12 13 14   CREATININE 1.07* 1.17* 1.09*  CALCIUM 9.3 9.1 8.1*  AST 20  --   --   ALT 13  --   --   ALKPHOS 64  --   --   BILITOT 1.9*  --   --    ------------------------------------------------------------------------------------------------------------------ No results for input(s): CHOL, HDL, LDLCALC, TRIG, CHOLHDL, LDLDIRECT in the last 72 hours.  No results found for: HGBA1C ------------------------------------------------------------------------------------------------------------------ No results for input(s): TSH, T4TOTAL, T3FREE, THYROIDAB in the last 72 hours.  Invalid input(s): FREET3 ------------------------------------------------------------------------------------------------------------------ No results for input(s): VITAMINB12, FOLATE, FERRITIN, TIBC, IRON, RETICCTPCT in the last 72 hours.  Coagulation profile No results for input(s): INR, PROTIME in the last 168 hours.  No results for input(s): DDIMER in the last 72 hours.  Cardiac Enzymes No results for input(s): CKMB, TROPONINI, MYOGLOBIN in the last 168 hours.  Invalid input(s):  CK ------------------------------------------------------------------------------------------------------------------ No results found for: BNP  Inpatient Medications  Scheduled Meds: . enoxaparin (LOVENOX) injection  40 mg Subcutaneous Daily  . metroNIDAZOLE  500 mg Oral BID  . sertraline  50 mg Oral Daily  . sodium chloride flush  3 mL Intravenous Q12H   Continuous Infusions: . cefTRIAXone (ROCEPHIN)  IV      PRN Meds:.acetaminophen, prochlorperazine  Micro Results Recent Results (from the past 240 hour(s))  Wet prep, genital     Status: Abnormal   Collection Time: 04/13/18  9:35 PM  Result Value Ref Range Status   Yeast Wet Prep HPF POC NONE SEEN NONE SEEN Final   Trich, Wet Prep NONE SEEN NONE SEEN Final   Clue Cells Wet Prep HPF POC PRESENT (A) NONE SEEN Final   WBC, Wet Prep HPF POC RARE (A) NONE SEEN Final   Sperm NONE SEEN  Final    Comment: Performed at [facility], [place] [address], [place], [place] [address]  Urine culture     Status: Abnormal (Preliminary result)   Collection Time: 04/13/18  9:35 PM  Result Value Ref Range Status   Specimen Description   Final    URINE, RANDOM Performed at [facility] [address], [place], [place] [address]    Special Requests   Final    NONE Performed at [facility], [place] [address] [place], [place] [address]    Culture (A)  Final    >=100,000 COLONIES/mL ESCHERICHIA COLI SUSCEPTIBILITIES TO FOLLOW Performed at [facility], [place] [address], [place], [place] [address]    Report Status PENDING  Incomplete    Radiology Reports Ct Abdomen Pelvis W Contrast  Result Date: 04/13/2018 CLINICAL DATA:  Flu like symptoms nausea vomiting EXAM: CT ABDOMEN AND PELVIS WITH CONTRAST TECHNIQUE: Multidetector CT imaging of the abdomen and pelvis was performed using the standard protocol following bolus administration of intravenous contrast. CONTRAST:  126mL ISOVUE-300 IOPAMIDOL (ISOVUE-300) INJECTION 61% COMPARISON:  None. FINDINGS: Lower chest: No acute abnormality. Hepatobiliary: No focal liver abnormality is seen. No gallstones, gallbladder wall thickening, or biliary dilatation. Pancreas: Unremarkable. No pancreatic ductal dilatation or surrounding inflammatory changes. Spleen: Normal in size without focal abnormality. Adrenals/Urinary Tract: Adrenal glands are unremarkable. Kidneys  are normal, without renal calculi, focal lesion, or hydronephrosis. Bladder is unremarkable. Stomach/Bowel: Stomach is within normal limits. Appendix appears normal. No evidence of bowel wall thickening, distention, or inflammatory changes. Vascular/Lymphatic: No significant vascular findings are present. No enlarged abdominal or pelvic lymph nodes. Reproductive: Intrauterine device.  No adnexal mass. Other: No free air or free fluid. Musculoskeletal: No acute or significant osseous findings. IMPRESSION: Negative. No CT evidence for acute intra-abdominal or pelvic abnormality. Electronically Signed   By: Donavan Foil M.D.   On: 04/13/2018 22:01    Time Spent in minutes  25   Layia Walla M.D on 04/15/2018 at 3:04 PM  Between 7am to 7pm - Pager - [phone]  After 7pm go to [email or web address] - password [facility]  [facility] -  Office  [phone]

## 2018-04-16 DIAGNOSIS — B9620 Unspecified Escherichia coli [E. coli] as the cause of diseases classified elsewhere: Secondary | ICD-10-CM | POA: Diagnosis not present

## 2018-04-16 DIAGNOSIS — N390 Urinary tract infection, site not specified: Secondary | ICD-10-CM

## 2018-04-16 DIAGNOSIS — R112 Nausea with vomiting, unspecified: Secondary | ICD-10-CM

## 2018-04-16 DIAGNOSIS — N12 Tubulo-interstitial nephritis, not specified as acute or chronic: Secondary | ICD-10-CM | POA: Diagnosis not present

## 2018-04-16 LAB — URINE CULTURE: Culture: >=100000 — AB

## 2018-04-16 MED ORDER — SODIUM CHLORIDE 0.9 % IV SOLN
1.0000 g | INTRAVENOUS | Status: DC
  Administered 2018-04-16: 1 g via INTRAVENOUS
  Filled 2018-04-16: qty 1

## 2018-04-16 MED ORDER — ACETAMINOPHEN 500 MG PO TABS: 500.0000 mg | ORAL_TABLET | Freq: Three times a day (TID) | ORAL | 0 refills | Status: AC | PRN

## 2018-04-16 MED ORDER — ONDANSETRON HCL 4 MG PO TABS
4.0000 mg | ORAL_TABLET | Freq: Three times a day (TID) | ORAL | Status: DC | PRN
  Administered 2018-04-16: 4 mg via ORAL
  Filled 2018-04-16: qty 1

## 2018-04-16 NOTE — Care Management Note (Signed)
Case Management Note  Patient Details  Name: Telisa Ohlsen MRN: [id number] Date of Birth: May 15, 2001  Subjective/Objective:                  Discharged  Action/Plan: Plan to return home with family/no cm needs present at time of discharge.  Expected Discharge Date:  04/16/18               Expected Discharge Plan:  Home/Self Care  In-House Referral:     Discharge planning Services  CM Consult  Post Acute Care Choice:    Choice offered to:     DME Arranged:    DME Agency:     HH Arranged:    HH Agency:     Status of Service:  Completed, signed off  If discussed at [employer] of Stay Meetings, dates discussed:    Additional Comments:  Leeroy Cha, RN 04/16/2018, 11:41 AM

## 2018-04-16 NOTE — Discharge Summary (Signed)
Physician Discharge Summary  Theresa Lopez NID:[id number] DOB: 14-May-2001 DOA: 04/14/2018  PCP: Patient, No Pcp Per  Admit date: 04/14/2018 Discharge date: 04/16/2018  Admitted From: Home Disposition: Home  Recommendations for Outpatient Follow-up:  1. Referral made to [facility] to establish care once infection subsided and prior test and surgery results from [facility] obtained. 2. Patient will complete 10-day antibiotic course on 11/22.  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  Patient developed acute onset of chills with fever, nausea, vomiting and diaphoresis with right lower abdomen and back pain on the day prior to admission.  She was seen in the ED with a CT of the abdomen pelvis was unremarkable and UA showing UTI, was given a dose of IV Rocephin and discharged home on p.o. antibiotic.  In the ED vitals were stable with WC of 19 K, UA positive for UTI.  Principal problem Acute pyelonephritis/E. coli UTI Pansensitive.  Receiving IV Rocephin.  Leukocytosis and flank pain improved.  Afebrile.  Was prescribed Keflex from the ED for 7-day course which she should complete upon discharge.  (Will receive 4 doses with of IV Rocephin prior to discharge).  Patient given written instructions on attempts to prevent recurrent UTIs.  History of recurrent UTI. As per mother she has frequent UTIs and multiple this year.  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  I have faxed box until in hospital medical records to request for her documents.  Informed patient and her mother to contact [facility] to schedule an appointment.  Anxiety/depression Continue Zoloft.    Family Communication  :  Mother and boyfriend at bedside  Disposition Plan  : Home    Consults  : None  Procedures  : CT abdomen and pelvis   Discharge Instructions   Allergies as of 04/16/2018      Reactions   Codeine Hives   Morphine And Related Hives      Medication List    STOP taking these medications   metroNIDAZOLE 500 MG tablet Commonly known as:  FLAGYL     TAKE these medications   acetaminophen 500 MG tablet Commonly known as:  TYLENOL Take 1 tablet (500 mg total) by mouth every 8 (eight) hours as needed for mild pain, moderate pain or fever. What changed:    how much to take  when to take this   cephALEXin 500 MG capsule Commonly known as:  KEFLEX Take 1 capsule (500 mg total) by mouth 3 (three) times daily for 7 days.   ferrous sulfate 325 (65 FE) MG EC tablet Take 325 mg by mouth daily.   ibuprofen 200 MG tablet Commonly known as:  ADVIL,MOTRIN Take 400 mg by mouth every 6 (six) hours as needed for mild pain.   levonorgestrel 20 MCG/24HR IUD Commonly known as:  MIRENA 1 each by Intrauterine route once.   ondansetron 4 MG disintegrating tablet Commonly known as:  ZOFRAN-ODT 4mg  ODT q4 hours prn nausea/vomit What changed:  how much to take  how to take this  when to take this  reasons to take this  additional instructions   PROBIOTIC PO Take 1 capsule by mouth daily.   sertraline 50 MG tablet Commonly known as:  ZOLOFT Take 50 mg by mouth daily.      Follow-up Information    [facility]. Schedule an appointment as soon as possible for a visit in 2 week(s).   Contact information: [place] [phone]         Allergies  Allergen Reactions  . Codeine Hives  . Morphine And Related Hives        Procedures/Studies: Ct Abdomen Pelvis W Contrast  Result Date: 04/13/2018 CLINICAL DATA:  Flu like symptoms nausea vomiting EXAM: CT ABDOMEN AND PELVIS WITH CONTRAST TECHNIQUE: Multidetector CT imaging of the abdomen and pelvis was performed using the standard protocol following bolus administration of intravenous contrast. CONTRAST:  166mL ISOVUE-300 IOPAMIDOL (ISOVUE-300) INJECTION 61% COMPARISON:  None. FINDINGS: Lower chest: No acute abnormality. Hepatobiliary: No focal liver abnormality is seen. No gallstones, gallbladder wall thickening, or biliary dilatation. Pancreas: Unremarkable. No pancreatic ductal dilatation or surrounding inflammatory changes. Spleen: Normal in size without focal abnormality. Adrenals/Urinary Tract: Adrenal glands are unremarkable. Kidneys are normal, without renal calculi, focal lesion, or hydronephrosis. Bladder is unremarkable. Stomach/Bowel: Stomach is within normal limits. Appendix appears normal. No evidence of bowel wall thickening, distention, or inflammatory changes. Vascular/Lymphatic: No significant vascular findings are present. No enlarged abdominal or pelvic lymph nodes. Reproductive: Intrauterine device.  No adnexal mass. Other: No free air or free fluid. Musculoskeletal: No acute or significant osseous findings. IMPRESSION: Negative. No CT evidence for acute intra-abdominal or pelvic abnormality. Electronically Signed   By: Donavan Foil M.D.   On: 04/13/2018 22:01       Subjective: Minimal right lower quadrant pain.  Afebrile.  Discharge Exam: Vitals:   04/16/18 0500 04/16/18 1000  BP: (!) 97/59 98/60  Pulse: 72   Resp: 16   Temp: 99.4 F (37.4 C)   SpO2: 98%    Vitals:   04/15/18 1354 04/15/18 2116 04/16/18 0500 04/16/18 1000  BP: 94/63 101/61 (!) 97/59 98/60  Pulse: 72 (!) 54 72    Resp: 20 16 16    Temp: 98.4 F (36.9 C) 98.7 F (37.1 C) 99.4 F (37.4 C)   TempSrc:      SpO2: 100% 100% 98%     General: Not in distress HEENT: Moist mucosa, supple neck Chest: Clear bilaterally CVs: Normal S1-S2, no murmurs GI: Soft, nondistended, bowel sounds present, minimal right lower quadrant tenderness, no flank tenderness Musculoskeletal: Warm, no edema    The results of significant diagnostics from this hospitalization (including imaging, microbiology, ancillary and laboratory) are listed below for reference.     Microbiology: Recent Results (from the past 240 hour(s))  Wet prep, genital     Status: Abnormal   Collection Time: 04/13/18  9:35 PM  Result Value Ref Range Status   Yeast Wet Prep HPF POC NONE SEEN NONE SEEN Final   Trich, Wet Prep NONE SEEN NONE SEEN Final   Clue Cells Wet Prep HPF POC PRESENT (A) NONE SEEN Final   WBC, Wet Prep HPF POC RARE (A) NONE SEEN Final   Sperm NONE SEEN  Final    Comment: Performed at [facility], [place] [address], [place], [place] [address]  Urine culture     Status: Abnormal  Collection Time: 04/13/18  9:35 PM  Result Value Ref Range Status   Specimen Description   Final    URINE, RANDOM Performed at [facility] [address], [place], [place] [address]    Special Requests   Final    NONE Performed at [facility], [place] [address], [place], [place] [address]    Culture >=100,000 COLONIES/mL ESCHERICHIA COLI (A)  Final   Report Status 04/16/2018 FINAL  Final   Organism ID, Bacteria ESCHERICHIA COLI (A)  Final      Susceptibility   Escherichia coli - MIC*    AMPICILLIN 4 SENSITIVE Sensitive     CEFAZOLIN <=4 SENSITIVE Sensitive     CEFTRIAXONE <=1 SENSITIVE Sensitive     CIPROFLOXACIN <=0.25 SENSITIVE Sensitive     GENTAMICIN <=1 SENSITIVE Sensitive     IMIPENEM <=0.25 SENSITIVE Sensitive     NITROFURANTOIN <=16 SENSITIVE Sensitive     TRIMETH/SULFA <=20  SENSITIVE Sensitive     AMPICILLIN/SULBACTAM <=2 SENSITIVE Sensitive     PIP/TAZO <=4 SENSITIVE Sensitive     Extended ESBL NEGATIVE Sensitive     * >=100,000 COLONIES/mL ESCHERICHIA COLI     Labs: BNP (last 3 results) No results for input(s): BNP in the last 8760 hours. Basic Metabolic Panel: Recent Labs  Lab 04/13/18 2052 04/14/18 2230 04/15/18 0621  NA 138 141 138  K 3.7 4.5 3.8  CL 104 105 108  CO2 23 25 24   GLUCOSE 93 112* 118*  BUN 12 13 14   CREATININE 1.07* 1.17* 1.09*  CALCIUM 9.3 9.1 8.1*   Liver Function Tests: Recent Labs  Lab 04/13/18 2052  AST 20  ALT 13  ALKPHOS 64  BILITOT 1.9*  PROT 7.2  ALBUMIN 4.5   Recent Labs  Lab 04/13/18 2052  LIPASE 22   No results for input(s): AMMONIA in the last 168 hours. CBC: Recent Labs  Lab 04/13/18 2052 04/14/18 2230 04/15/18 0621  WBC 12.5* 19.1* 14.7*  NEUTROABS  --  15.9*  --   HGB 14.0 13.6 11.7*  HCT 41.9 41.1 34.1*  MCV 96.8 98.3 95.5  PLT 178 155 160   Cardiac Enzymes: No results for input(s): CKTOTAL, CKMB, CKMBINDEX, TROPONINI in the last 168 hours. BNP: Invalid input(s): POCBNP CBG: No results for input(s): GLUCAP in the last 168 hours. D-Dimer No results for input(s): DDIMER in the last 72 hours. Hgb A1c No results for input(s): HGBA1C in the last 72 hours. Lipid Profile No results for input(s): CHOL, HDL, LDLCALC, TRIG, CHOLHDL, LDLDIRECT in the last 72 hours. Thyroid function studies No results for input(s): TSH, T4TOTAL, T3FREE, THYROIDAB in the last 72 hours.  Invalid input(s): FREET3 Anemia work up No results for input(s): VITAMINB12, FOLATE, FERRITIN, TIBC, IRON, RETICCTPCT in the last 72 hours. Urinalysis    Component Value Date/Time   COLORURINE AMBER (A) 04/14/2018 2042   APPEARANCEUR HAZY (A) 04/14/2018 2042   LABSPEC 1.025 04/14/2018 2042   PHURINE 5.0 04/14/2018 2042   GLUCOSEU NEGATIVE 04/14/2018 2042   HGBUR NEGATIVE 04/14/2018 2042   BILIRUBINUR NEGATIVE  04/14/2018 2042   KETONESUR 80 (A) 04/14/2018 2042   PROTEINUR 100 (A) 04/14/2018 2042   NITRITE NEGATIVE 04/14/2018 2042   LEUKOCYTESUR SMALL (A) 04/14/2018 2042   Sepsis Labs Invalid input(s): PROCALCITONIN,  WBC,  LACTICIDVEN Microbiology Recent Results (from the past 240 hour(s))  Wet prep, genital     Status: Abnormal   Collection Time: 04/13/18  9:35 PM  Result Value Ref Range  Status   Yeast Wet Prep HPF POC NONE SEEN NONE SEEN Final   Trich, Wet Prep NONE SEEN NONE SEEN Final   Clue Cells Wet Prep HPF POC PRESENT (A) NONE SEEN Final   WBC, Wet Prep HPF POC RARE (A) NONE SEEN Final   Sperm NONE SEEN  Final    Comment: Performed at [facility], [place] [address] [place], [place] [address]  Urine culture     Status: Abnormal   Collection Time: 04/13/18  9:35 PM  Result Value Ref Range Status   Specimen Description   Final    URINE, RANDOM Performed at [facility] [address] [place], [place] [address]    Special Requests   Final    NONE Performed at [facility], [place] [address], [place], [place] [address]    Culture >=100,000 COLONIES/mL ESCHERICHIA COLI (A)  Final   Report Status 04/16/2018 FINAL  Final   Organism ID, Bacteria ESCHERICHIA COLI (A)  Final      Susceptibility   Escherichia coli - MIC*    AMPICILLIN 4 SENSITIVE Sensitive     CEFAZOLIN <=4 SENSITIVE Sensitive     CEFTRIAXONE <=1 SENSITIVE Sensitive     CIPROFLOXACIN <=0.25 SENSITIVE Sensitive     GENTAMICIN <=1 SENSITIVE Sensitive     IMIPENEM <=0.25 SENSITIVE Sensitive     NITROFURANTOIN <=16 SENSITIVE Sensitive     TRIMETH/SULFA <=20 SENSITIVE Sensitive     AMPICILLIN/SULBACTAM <=2 SENSITIVE Sensitive     PIP/TAZO <=4 SENSITIVE Sensitive     Extended ESBL NEGATIVE Sensitive     * >=100,000 COLONIES/mL ESCHERICHIA COLI     Time coordinating discharge: <77minutes  SIGNED:   Louellen Molder, MD  [facility] 04/16/2018, 11:28  AM Pager   If 7PM-7AM, please contact night-coverage [email or web address] Password [email or web address]

## 2018-04-16 NOTE — Discharge Instructions (Signed)

## 2018-04-16 NOTE — Progress Notes (Signed)
Patient has discharged to home. Discharge instruction including medication and appointment was given to patient and family at bedside. Patient has no question at this time.

## 2018-06-26 ENCOUNTER — Encounter (HOSPITAL_COMMUNITY): Payer: Self-pay | Admitting: Emergency Medicine

## 2018-06-26 ENCOUNTER — Emergency Department (HOSPITAL_COMMUNITY)
Admission: EM | Admit: 2018-06-26 | Discharge: 2018-06-26 | Disposition: A | Discharge status: Home / Self Care | Payer: Managed Care, Other (non HMO) | Attending: Emergency Medicine | Admitting: Emergency Medicine

## 2018-06-26 DIAGNOSIS — Z79899 Other long term (current) drug therapy: Secondary | ICD-10-CM | POA: Diagnosis not present

## 2018-06-26 DIAGNOSIS — N6021 Fibroadenosis of right breast: Secondary | ICD-10-CM | POA: Diagnosis not present

## 2018-06-26 DIAGNOSIS — N63 Unspecified lump in unspecified breast: Secondary | ICD-10-CM | POA: Diagnosis present

## 2018-06-26 DIAGNOSIS — D241 Benign neoplasm of right breast: Secondary | ICD-10-CM

## 2018-06-26 MED ORDER — NAPROXEN 500 MG PO TABS: 500.0000 mg | ORAL_TABLET | Freq: Two times a day (BID) | ORAL | 0 refills | Status: AC

## 2018-06-26 NOTE — ED Provider Notes (Signed)
[facility] DEPT Provider Note   CSN: [id number] Arrival date & time: 06/26/18  1534     History   Chief Complaint Chief Complaint  Patient presents with  . knot in breast    HPI Theresa Lopez is a 18 y.o. female with a past medical history of anxiety who presents to ED for 19-month history of mass in her right breast.  States that she was seen and evaluated at her primary care provider's office in [place] about 1 month ago and had ultrasound done.  States that her primary care provider send the ultrasound results to the ED today.  She states that the pain has gotten progressively worse, she feels that the mass is growing.  She has not been taking any medications to help with her symptoms. Pain is sharp, worse with movement such as running and palpation.  She denies any redness, drainage, fever, trauma to the area. No family history of breast cancer.  HPI  Past Medical History:  Diagnosis Date  . Anxiety   . Depression   . Hypotension   . Panic attacks     Patient Active Problem List   Diagnosis Date Noted  . E-coli UTI 04/16/2018  . Pyelonephritis 04/15/2018    Past Surgical History:  Procedure Laterality Date  . ADENOIDECTOMY    . JOINT REPLACEMENT    . KIDNEY SURGERY    . TONSILLECTOMY       OB History   No obstetric history on file.      Home Medications    Prior to Admission medications   Medication Sig Start Date End Date Taking? Authorizing Provider  ferrous sulfate 325 (65 FE) MG EC tablet Take 325 mg by mouth at bedtime.    Yes [provider]  ibuprofen (ADVIL,MOTRIN) 200 MG tablet Take 400 mg by mouth every 6 (six) hours as needed for mild pain.   Yes [provider]  levonorgestrel (MIRENA) 20 MCG/24HR IUD 1 each by Intrauterine route once.   Yes [provider]  nitrofurantoin, macrocrystal-monohydrate, (MACROBID) 100 MG capsule Take 1 capsule by mouth at bedtime. 05/05/18  Yes [provider]  Probiotic Product (PROBIOTIC PO) Take 1 capsule by mouth at bedtime.    Yes [provider]  sertraline (ZOLOFT) 25 MG tablet Take 25 mg by mouth at bedtime.   Yes [provider]  acetaminophen (TYLENOL) 500 MG tablet Take 1 tablet (500 mg total) by mouth every 8 (eight) hours as needed for mild pain, moderate pain or fever. Patient not taking: Reported on 06/26/2018 04/16/18   Dhungel, Flonnie Overman, MD  naproxen (NAPROSYN) 500 MG tablet Take 1 tablet (500 mg total) by mouth 2 (two) times daily. 06/26/18   Delia Heady, PA-C  ondansetron (ZOFRAN ODT) 4 MG disintegrating tablet 4mg  ODT q4 hours prn nausea/vomit Patient not taking: Reported on 06/26/2018 04/13/18   Jacqlyn Larsen, PA-C    Family History No family history on file.  Social History Social History   Tobacco Use  . Smoking status: Never Smoker  . Smokeless tobacco: Never Used  Substance Use Topics  . Alcohol use: Yes    Comment: occassional  . Drug use: Yes    Types: Marijuana     Allergies   Codeine and Morphine and related   Review of Systems Review of Systems  Constitutional: Negative for appetite change, chills and fever.  HENT: Negative for ear pain, rhinorrhea, sneezing and sore throat.   Eyes: Negative for photophobia  and visual disturbance.  Respiratory: Negative for cough, chest tightness, shortness of breath and wheezing.   Cardiovascular: Negative for chest pain and palpitations.  Gastrointestinal: Negative for abdominal pain, blood in stool, constipation, diarrhea, nausea and vomiting.  Genitourinary: Negative for dysuria, hematuria and urgency.  Musculoskeletal: Negative for myalgias.  Skin: Negative for rash.       + mass in R breast  Neurological: Negative for dizziness, weakness and light-headedness.     Physical Exam Updated Vital Signs BP 110/71 (BP Location: Left Arm)   Pulse 92   Temp 98.7 F (37.1 C) (Oral)   Resp 18   SpO2 100%   Physical Exam Vitals  signs and nursing note reviewed. Exam conducted with a chaperone present.  Constitutional:      General: She is not in acute distress.    Appearance: She is well-developed.  HENT:     Head: Normocephalic and atraumatic.     Nose: Nose normal.  Eyes:     General: No scleral icterus.       Right eye: No discharge.        Left eye: No discharge.     Conjunctiva/sclera: Conjunctivae normal.  Neck:     Musculoskeletal: Normal range of motion and neck supple.  Cardiovascular:     Rate and Rhythm: Normal rate and regular rhythm.     Heart sounds: Normal heart sounds. No murmur. No friction rub. No gallop.   Pulmonary:     Effort: Pulmonary effort is normal. No respiratory distress.     Breath sounds: Normal breath sounds.  Abdominal:     General: Bowel sounds are normal. There is no distension.     Palpations: Abdomen is soft.     Tenderness: There is no abdominal tenderness. There is no guarding.  Musculoskeletal: Normal range of motion.  Skin:    General: Skin is warm and dry.     Findings: No rash.     Comments: 2cm palpable mass noted under R nipple. No nipple changes, drainage, erythema or warm of skin noted. Induration palpated on R lower breast. There is a nipple piercing present.   Neurological:     Mental Status: She is alert.     Motor: No abnormal muscle tone.     Coordination: Coordination normal.      ED Treatments / Results  Labs (all labs ordered are listed, but only abnormal results are displayed) Labs Reviewed - No data to display  EKG None  Radiology No results found.  Procedures Procedures (including critical care time)  Medications Ordered in ED Medications - No data to display   Initial Impression / Assessment and Plan / ED Course  I have reviewed the triage vital signs and the nursing notes.  Pertinent labs & imaging results that were available during my care of the patient were reviewed by me and considered in my medical decision making (see  chart for details).  Clinical Course as of Jun 26 1649  Fri Jun 26, 2018  1642 Ultrasound results from right breast ultrasound done on 05/25/2018 shows 2.6 cm mass in the retroareolar region findings most likely representing a fibroadenoma.  A 73-month follow-up ultrasound should be performed to assess stability. Most likely benign per BI-RADS.   [HK]    Clinical Course User Index [HK] Delia Heady, PA-C    18 year old female presents to ED for 58-month history of right breast mass.  She feels that the area is getting more painful and larger.  She  had a breast ultrasound done on 05/25/2018 by her PCP in [place].  Results were faxed over to the ED.  Findings consistent with fibroadenoma of breast. She is concerned because the pain is worse. On my exam there is a small mass palpated with no overlying skin changes.  Vital signs are within normal limits.  She denies possibility of pregnancy. I have called our breast center who states that patient needs an order for a repeat ultrasound which cannot be ordered by the ED providers.  Patient does not have a primary care provider in the area but is a Ship broker at [employer].  Is unsure if she has a Cabin crew.  I spoke to her mother who states that she has been "getting the run around by the doctors here, no one wants to be her doctor or they don't have any availabilities for new patients."  Asked patient to inquire about student health or ask her primary care provider in [place] for order for repeat ultrasound.  In the meantime we will give prescription for NSAIDs. I do not see any signs of abscess at this time.  Patient is hemodynamically stable, in NAD, and able to ambulate in the ED. Evaluation does not show pathology that would require ongoing emergent intervention or inpatient treatment. I explained the diagnosis to the patient. Pain has been managed and has no complaints prior to discharge. Patient is comfortable with above plan and is stable for  discharge at this time. All questions were answered prior to disposition. Strict return precautions for returning to the ED were discussed. Encouraged follow up with PCP.    Portions of this note were generated with Lobby[employer]. Dictation errors may occur despite best attempts at proofreading.   Final Clinical Impressions(s) / ED Diagnoses   Final diagnoses:  Fibroadenoma of right breast    ED Discharge Orders         Ordered    naproxen (NAPROSYN) 500 MG tablet  2 times daily     06/26/18 [address] PA-C 06/26/18 1651    Julianne Rice, MD 06/27/18 1646

## 2018-06-26 NOTE — Discharge Instructions (Addendum)
You will need to follow-up at the breast center for a repeat ultrasound as ordered by your primary care provider. Return to ED for worsening symptoms, redness or drainage of the breast, fever.

## 2018-06-26 NOTE — ED Triage Notes (Signed)
Pt c/o pain in knot in right breast that had for several months. Reports had her PCP that saw back in December to send ultrasound results to ED.

## 2018-06-29 ENCOUNTER — Other Ambulatory Visit: Payer: Self-pay | Admitting: Pediatrics

## 2018-06-29 DIAGNOSIS — N6310 Unspecified lump in the right breast, unspecified quadrant: Secondary | ICD-10-CM

## 2018-06-30 ENCOUNTER — Other Ambulatory Visit: Payer: Self-pay | Admitting: Registered Nurse

## 2018-06-30 ENCOUNTER — Other Ambulatory Visit: Payer: Self-pay | Admitting: Nurse Practitioner

## 2018-06-30 DIAGNOSIS — N6310 Unspecified lump in the right breast, unspecified quadrant: Secondary | ICD-10-CM

## 2018-07-15 ENCOUNTER — Ambulatory Visit
Admission: RE | Admit: 2018-07-15 | Discharge: 2018-07-15 | Disposition: A | Discharge status: Home / Self Care | Payer: Managed Care, Other (non HMO) | Source: Ambulatory Visit | Attending: Pediatrics | Admitting: Pediatrics

## 2018-07-15 DIAGNOSIS — N6310 Unspecified lump in the right breast, unspecified quadrant: Secondary | ICD-10-CM

## 2018-07-22 ENCOUNTER — Encounter: Payer: Self-pay | Admitting: General Surgery

## 2018-09-07 ENCOUNTER — Ambulatory Visit: Payer: Self-pay | Admitting: Cardiology

## 2018-11-09 ENCOUNTER — Ambulatory Visit: Payer: Self-pay | Admitting: Cardiology

## 2018-11-09 NOTE — Progress Notes (Deleted)
Primary Physician:  Patient, No Pcp Per   Patient ID: Paislee Szatkowski, female    DOB: 07-14-2000, 18 y.o.   MRN: [id number]  Subjective:    No chief complaint on file.   HPI: Rayan Dyal  is a 18 y.o. female  with history of vasovagal syncope and anxiety/depression. Noted to have borderline orthostasis at her last office visit.   Last 6 months ago for syncope. For the last one year has had 2 episodes of syncope 1 while walking around the yard, she suddenly felt like she would pass out. Her boyfriend was with her at the time, no loss of bowel or bladder. She awoke with abdominal pain and lack of energy that persisted throughout the day. She's had one other episode that occurred while in the shower with similar symptoms. She has also noticed that with sudden position changes she looses her vision and also has changes in her hearing. She denies any palpitations or heart racing. No chest pain or shortness of breath. Denies any leg edema. Does mention that over the last year she has noticed that she is bruising very easily and had CBC performed at PCP office that found low iron levels.   She previously played several sports, but now exercises at the gym on occasion with running one to 2 miles without exertional difficulty. She is a Administrator, arts at [employer]. Reports her father has enlarged heart and hypertension, but is unaware of any other family history of heart disease.   Past Medical History:  Diagnosis Date  . Anxiety   . Depression   . Hypotension   . Panic attacks     Past Surgical History:  Procedure Laterality Date  . ADENOIDECTOMY    . JOINT REPLACEMENT    . KIDNEY SURGERY    . TONSILLECTOMY      Social History   Socioeconomic History  . Marital status: Single    Spouse name: Not on file  . Number of children: Not on file  . Years of education: Not on file  . Highest education level: Not on file  Occupational History  . Not on file  Social  Needs  . Financial resource strain: Not on file  . Food insecurity:    Worry: Not on file    Inability: Not on file  . Transportation needs:    Medical: Not on file    Non-medical: Not on file  Tobacco Use  . Smoking status: Never Smoker  . Smokeless tobacco: Never Used  Substance and Sexual Activity  . Alcohol use: Yes    Comment: occassional  . Drug use: Yes    Types: Marijuana  . Sexual activity: Yes    Birth control/protection: I.U.D.  Lifestyle  . Physical activity:    Days per week: Not on file    Minutes per session: Not on file  . Stress: Not on file  Relationships  . Social connections:    Talks on phone: Not on file    Gets together: Not on file    Attends religious service: Not on file    Active member of club or organization: Not on file    Attends meetings of clubs or organizations: Not on file    Relationship status: Not on file  . Intimate partner violence:    Fear of current or ex partner: Not on file    Emotionally abused: Not on file    Physically abused: Not on file    Forced sexual activity: Not  on file  Other Topics Concern  . Not on file  Social History Narrative  . Not on file    Review of Systems  Constitution: Negative for decreased appetite, malaise/fatigue, weight gain and weight loss.  Eyes: Negative for visual disturbance.  Cardiovascular: Negative for chest pain, claudication, dyspnea on exertion, leg swelling, orthopnea, palpitations and syncope.  Respiratory: Negative for hemoptysis and wheezing.   Endocrine: Negative for cold intolerance and heat intolerance.  Hematologic/Lymphatic: Does not bruise/bleed easily.  Skin: Negative for nail changes.  Musculoskeletal: Negative for muscle weakness and myalgias.  Gastrointestinal: Negative for abdominal pain, change in bowel habit, nausea and vomiting.  Neurological: Positive for dizziness. Negative for difficulty with concentration, focal weakness and headaches.  Psychiatric/Behavioral:  Negative for altered mental status and suicidal ideas.  All other systems reviewed and are negative.     Objective:  There were no vitals taken for this visit. There is no height or weight on file to calculate BMI.    Physical Exam  Constitutional: She is oriented to person, place, and time. Vital signs are normal. She appears well-developed and well-nourished.  HENT:  Head: Normocephalic and atraumatic.  Neck: Normal range of motion.  Cardiovascular: Normal rate, regular rhythm, normal heart sounds and intact distal pulses.  Pulmonary/Chest: Effort normal and breath sounds normal. No accessory muscle usage. No respiratory distress.  Abdominal: Soft. Bowel sounds are normal.  Musculoskeletal: Normal range of motion.  Neurological: She is alert and oriented to person, place, and time.  Skin: Skin is warm and dry.  Vitals reviewed.  Radiology: No results found.  Laboratory examination:   *** CMP Latest Ref Rng & Units 04/15/2018 04/14/2018 04/13/2018  Glucose 70 - 99 mg/dL 118(H) 112(H) 93  BUN 6 - 20 mg/dL 14 13 12   Creatinine 0.44 - 1.00 mg/dL 1.09(H) 1.17(H) 1.07(H)  Sodium 135 - 145 mmol/L 138 141 138  Potassium 3.5 - 5.1 mmol/L 3.8 4.5 3.7  Chloride 98 - 111 mmol/L 108 105 104  CO2 22 - 32 mmol/L 24 25 23   Calcium 8.9 - 10.3 mg/dL 8.1(L) 9.1 9.3  Total Protein 6.5 - 8.1 g/dL - - 7.2  Total Bilirubin 0.3 - 1.2 mg/dL - - 1.9(H)  Alkaline Phos 38 - 126 U/L - - 64  AST 15 - 41 U/L - - 20  ALT 0 - 44 U/L - - 13   CBC Latest Ref Rng & Units 04/15/2018 04/14/2018 04/13/2018  WBC 4.0 - 10.5 K/uL 14.7(H) 19.1(H) 12.5(H)  Hemoglobin 12.0 - 15.0 g/dL 11.7(L) 13.6 14.0  Hematocrit 36.0 - 46.0 % 34.1(L) 41.1 41.9  Platelets 150 - 400 K/uL 160 155 178   Lipid Panel  No results found for: CHOL, TRIG, HDL, CHOLHDL, VLDL, LDLCALC, LDLDIRECT HEMOGLOBIN A1C No results found for: HGBA1C, MPG TSH No results for input(s): TSH in the last 8760 hours.  PRN Meds:. There are no  discontinued medications. No outpatient medications have been marked as taking for the 11/09/18 encounter (Appointment) with Miquel Dunn, NP.    Cardiac Studies:     Assessment:   No diagnosis found.  EKG 03/20/2018: Sinus rhythm at 52 bpm, normal axis, normal conduction.   Recommendations:   ***  Miquel Dunn, MSN, APRN, FNP-C [facility]. [place] Office: [phone] Fax: [phone]

## 2018-12-09 ENCOUNTER — Telehealth: Payer: Self-pay

## 2020-08-26 IMAGING — US ULTRASOUND RIGHT BREAST LIMITED
1 series · 6 of 6 positions shown · non-contrast
Comparison: Previous exam(s).

Addendum:
CLINICAL DATA: Patient presents for palpable abnormality within the
right breast. Patient presents at the mass is painful and she feels
it has enlarged in size over the last few months.

EXAM:
ULTRASOUND OF THE RIGHT BREAST

[Series 1: ultrasound right breast limited · 0.06mm/px · 6 of 6 slices shown]
[im 1/6]
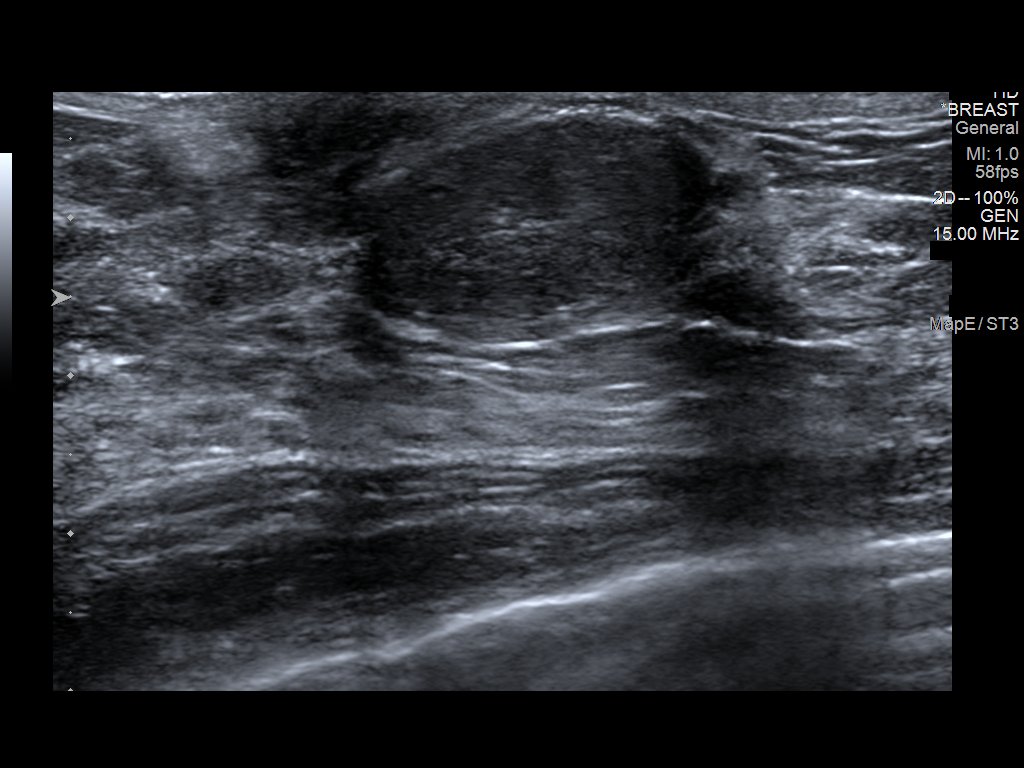
[im 2/6]
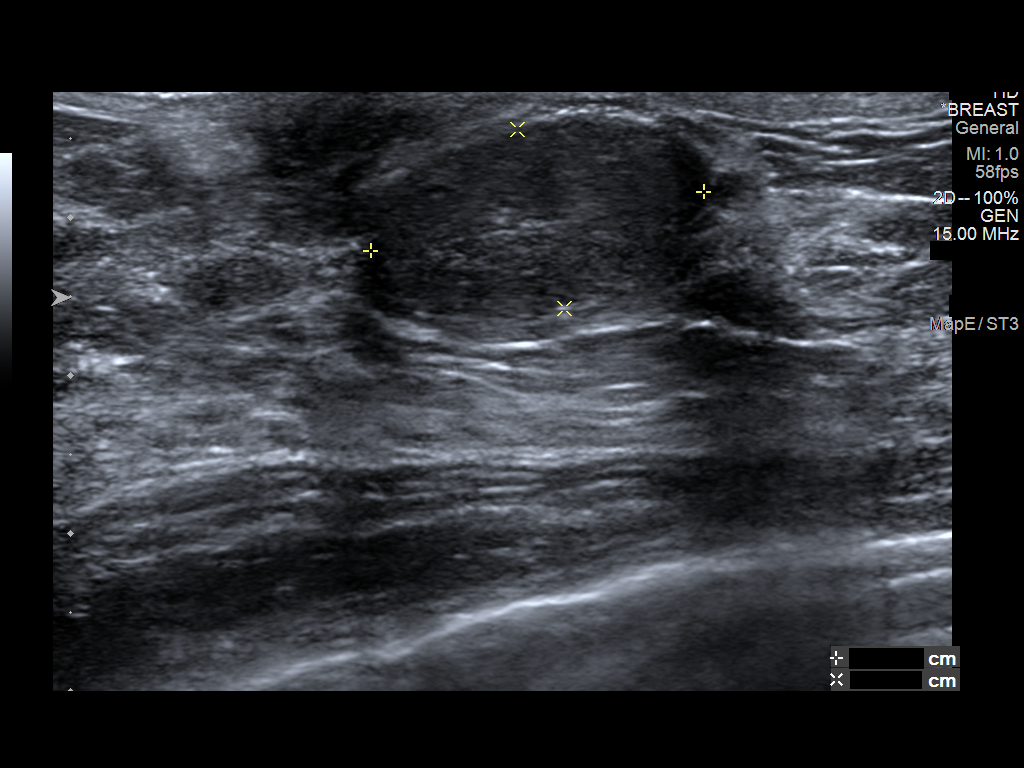
[im 3/6]
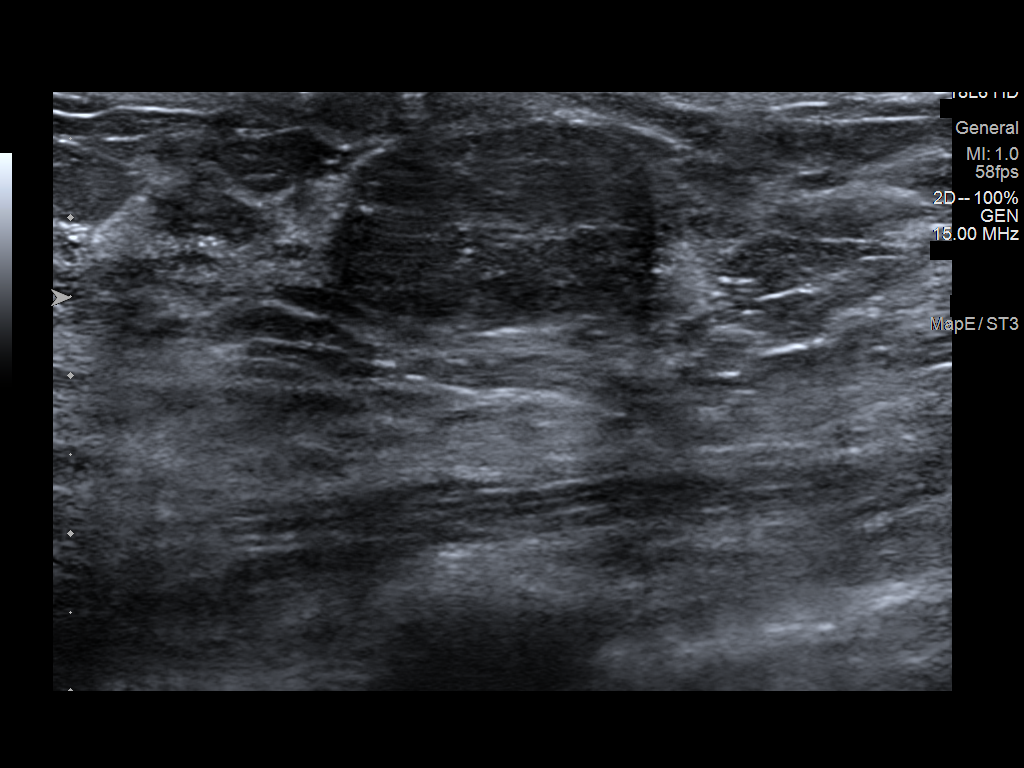
[im 4/6]
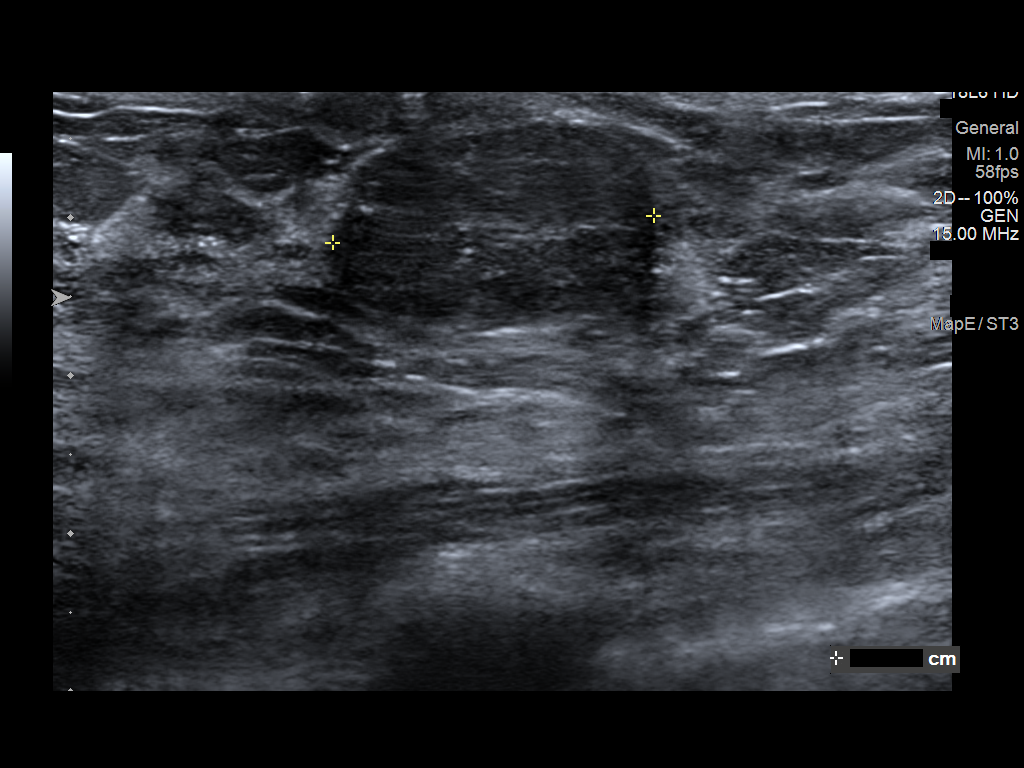
[im 5/6]
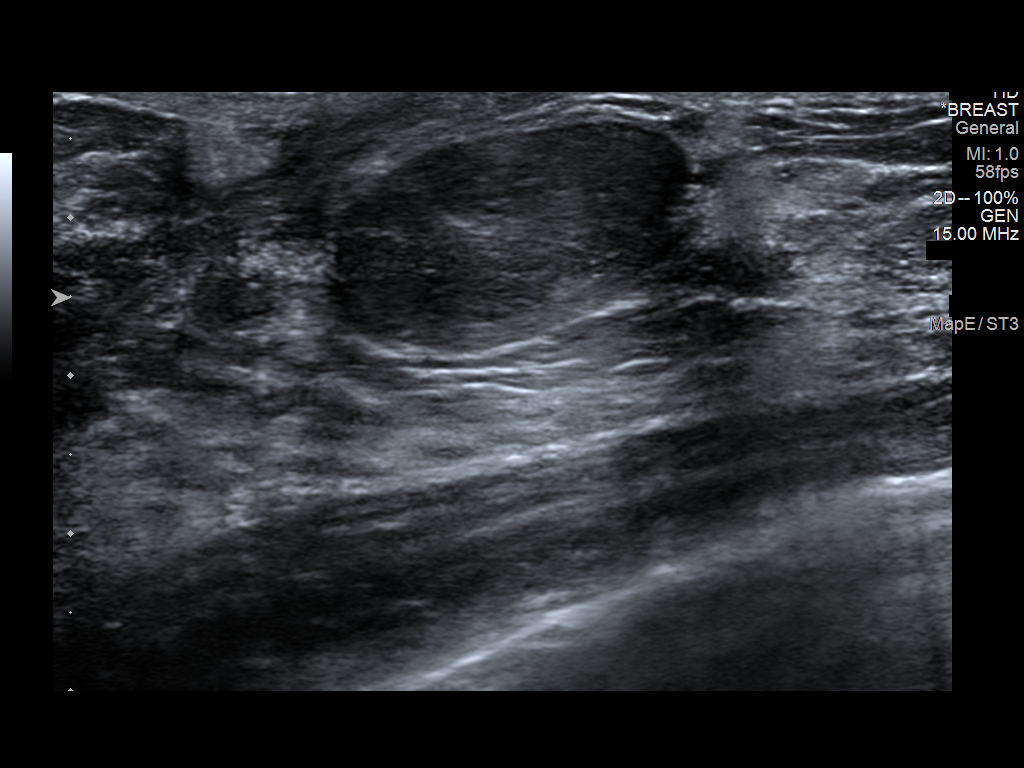
[im 6/6]
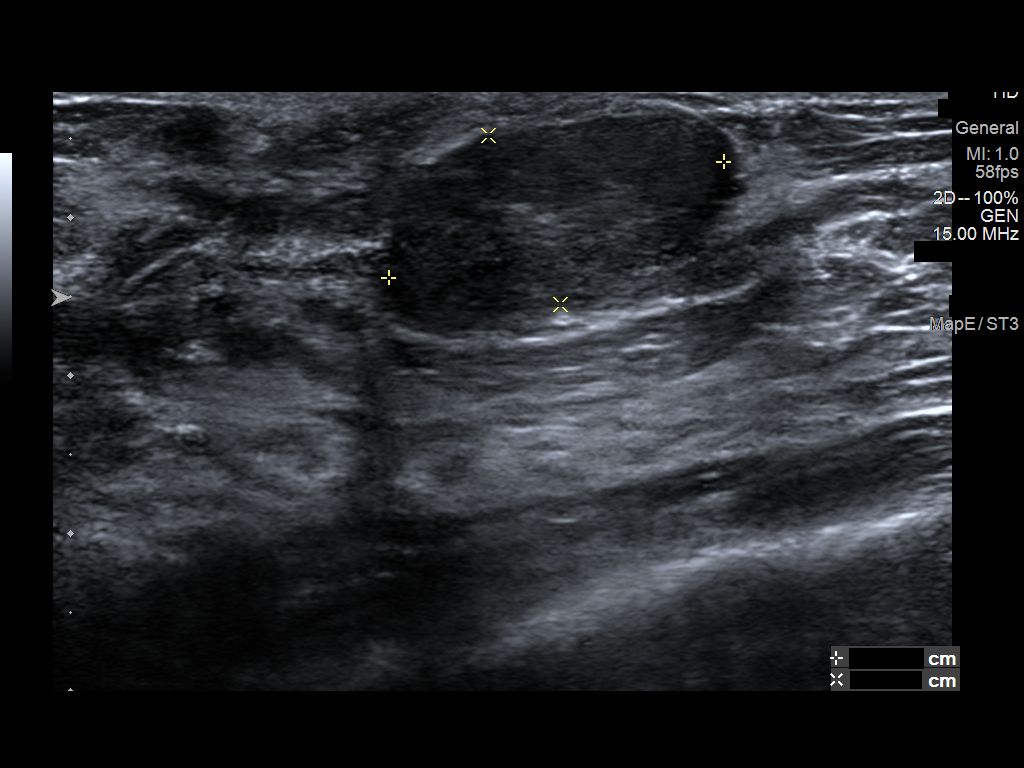

[6 of 6 positions shown; findings below may reference images not displayed]

FINDINGS: On physical exam, there is a small mobile mass within the inferior
right breast.

Targeted ultrasound is performed, showing a stable 2.1 x 1.2 x
cm oval circumscribed hypoechoic mass right breast 6 o'clock
position retroareolar location.
IMPRESSION: Oval circumscribed hypoechoic mass corresponding with palpable
abnormality, potentially representing a fibroadenoma.

RECOMMENDATION:
In discussion with the patient, the patient desires to have the mass
surgically removed given her perceived growth of the mass as well as
associated pain/tenderness. Patient will be referred for surgical
consultation to discuss surgical removal. If the mass is not
removed, follow-up ultrasound in 6 months would be recommended.

I have discussed the findings and recommendations with the patient.
Results were also provided in writing at the conclusion of the
visit. If applicable, a reminder letter will be sent to the patient
regarding the next appointment.

BI-RADS CATEGORY  3: Probably benign.

ADDENDUM:
Surgical consultation has been arranged with Dr. Trafford Bapare at
[[email or web address]] on July 23, 2018.

Marmar Vz, RN on 07/21/2018.

*** End of Addendum ***
